# Patient Record
Sex: Female | Born: 1985
Health system: Southern US, Community
[De-identification: ages and names within clinical notes are randomized; demographics above are authoritative.]

## PROBLEM LIST (undated history)

## (undated) DIAGNOSIS — E663 Overweight: Secondary | ICD-10-CM

## (undated) HISTORY — DX: Overweight: E66.3

---
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
## 2007-03-07 ENCOUNTER — Emergency Department (HOSPITAL_COMMUNITY): Admission: EM | Admit: 2007-03-07 | Discharge: 2007-03-07 | Payer: Self-pay | Admitting: Emergency Medicine

## 2007-03-09 ENCOUNTER — Emergency Department (HOSPITAL_COMMUNITY): Admission: EM | Admit: 2007-03-09 | Discharge: 2007-03-09 | Payer: Self-pay | Admitting: Family Medicine

## 2007-06-07 ENCOUNTER — Inpatient Hospital Stay (HOSPITAL_COMMUNITY): Admission: AD | Admit: 2007-06-07 | Discharge: 2007-06-10 | Payer: Self-pay | Admitting: Obstetrics & Gynecology

## 2007-06-07 ENCOUNTER — Emergency Department (HOSPITAL_COMMUNITY): Admission: EM | Admit: 2007-06-07 | Discharge: 2007-06-07 | Payer: Self-pay | Admitting: Emergency Medicine

## 2007-07-14 ENCOUNTER — Emergency Department (HOSPITAL_COMMUNITY): Admission: EM | Admit: 2007-07-14 | Discharge: 2007-07-14 | Payer: Self-pay | Admitting: Emergency Medicine

## 2007-11-16 ENCOUNTER — Inpatient Hospital Stay (HOSPITAL_COMMUNITY): Admission: AD | Admit: 2007-11-16 | Discharge: 2007-11-16 | Payer: Self-pay | Admitting: Family Medicine

## 2008-02-14 ENCOUNTER — Emergency Department (HOSPITAL_COMMUNITY): Admission: EM | Admit: 2008-02-14 | Discharge: 2008-02-14 | Payer: Self-pay | Admitting: Emergency Medicine

## 2008-10-10 IMAGING — CR DG FOREARM 2V*R*
2 series · 2 of 2 positions shown · non-contrast
Comparison: None

CLINICAL DATA: Trauma with forearm pain

RIGHT FOREARM - 2 VIEW

[view not recorded (1 of 2)]
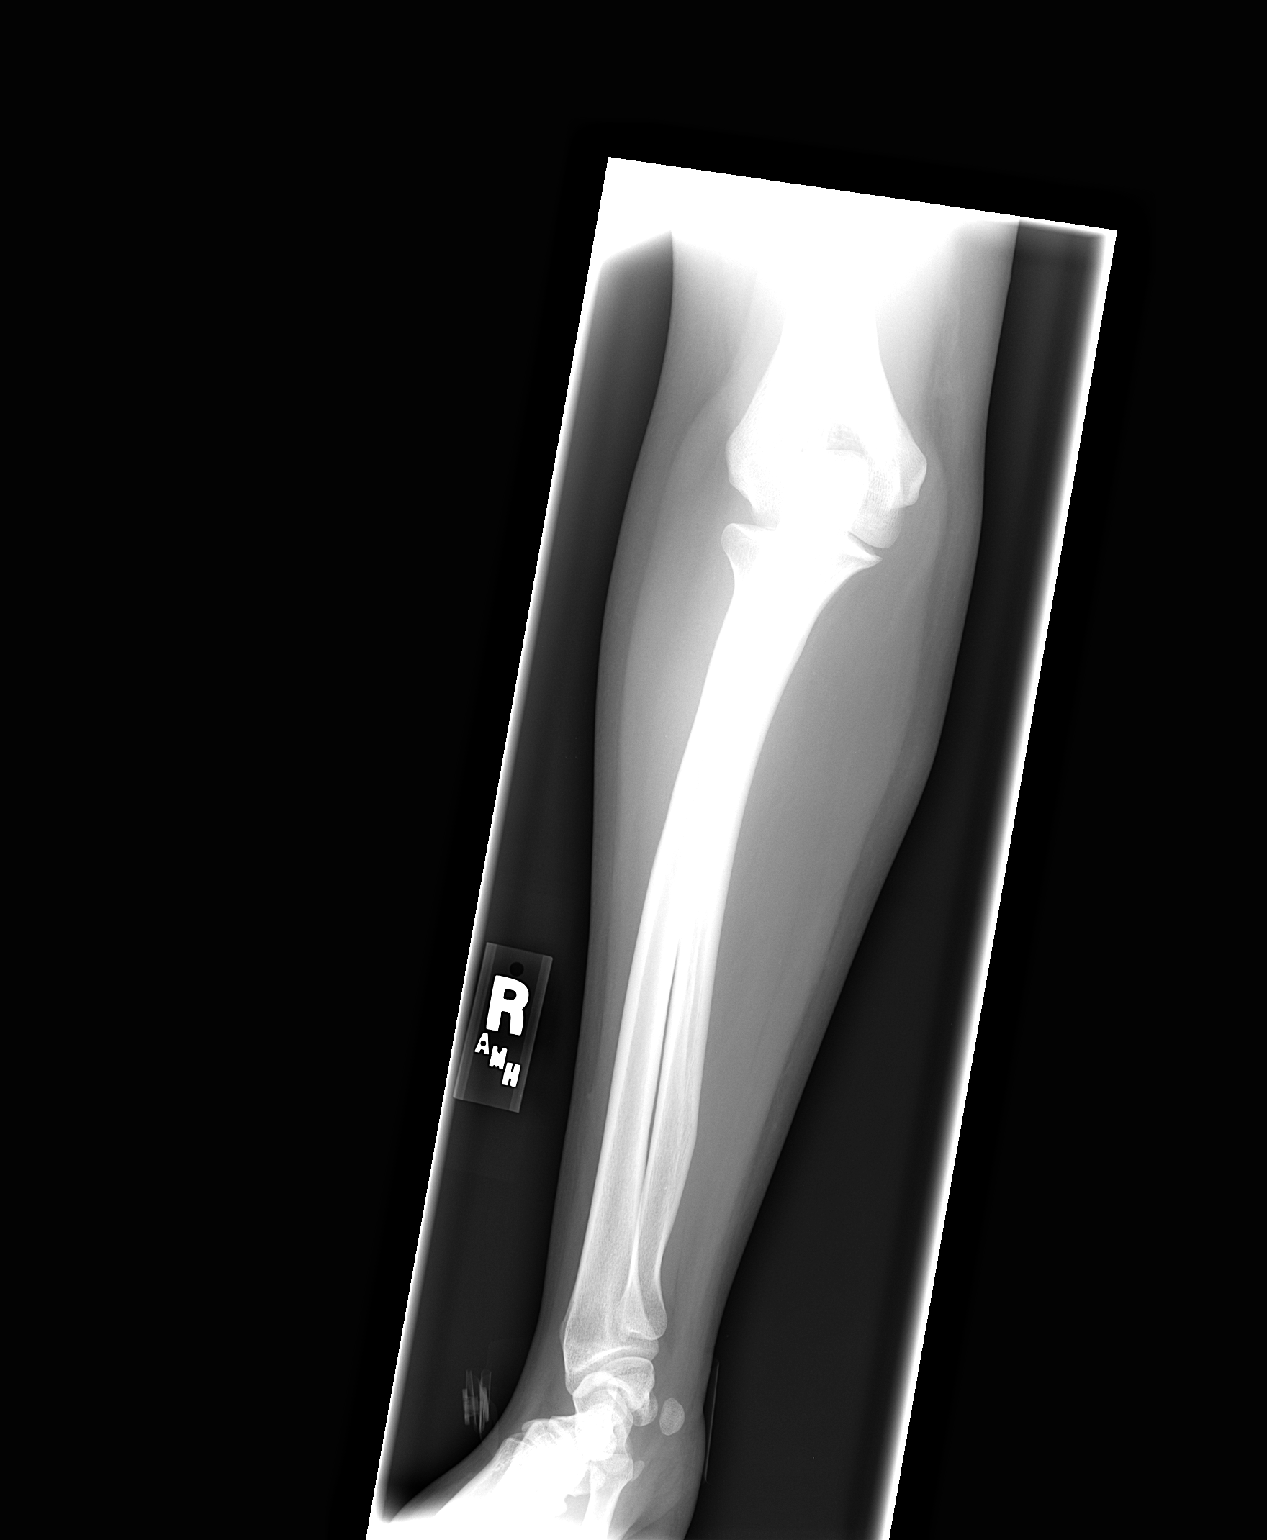

[view not recorded (2 of 2)]
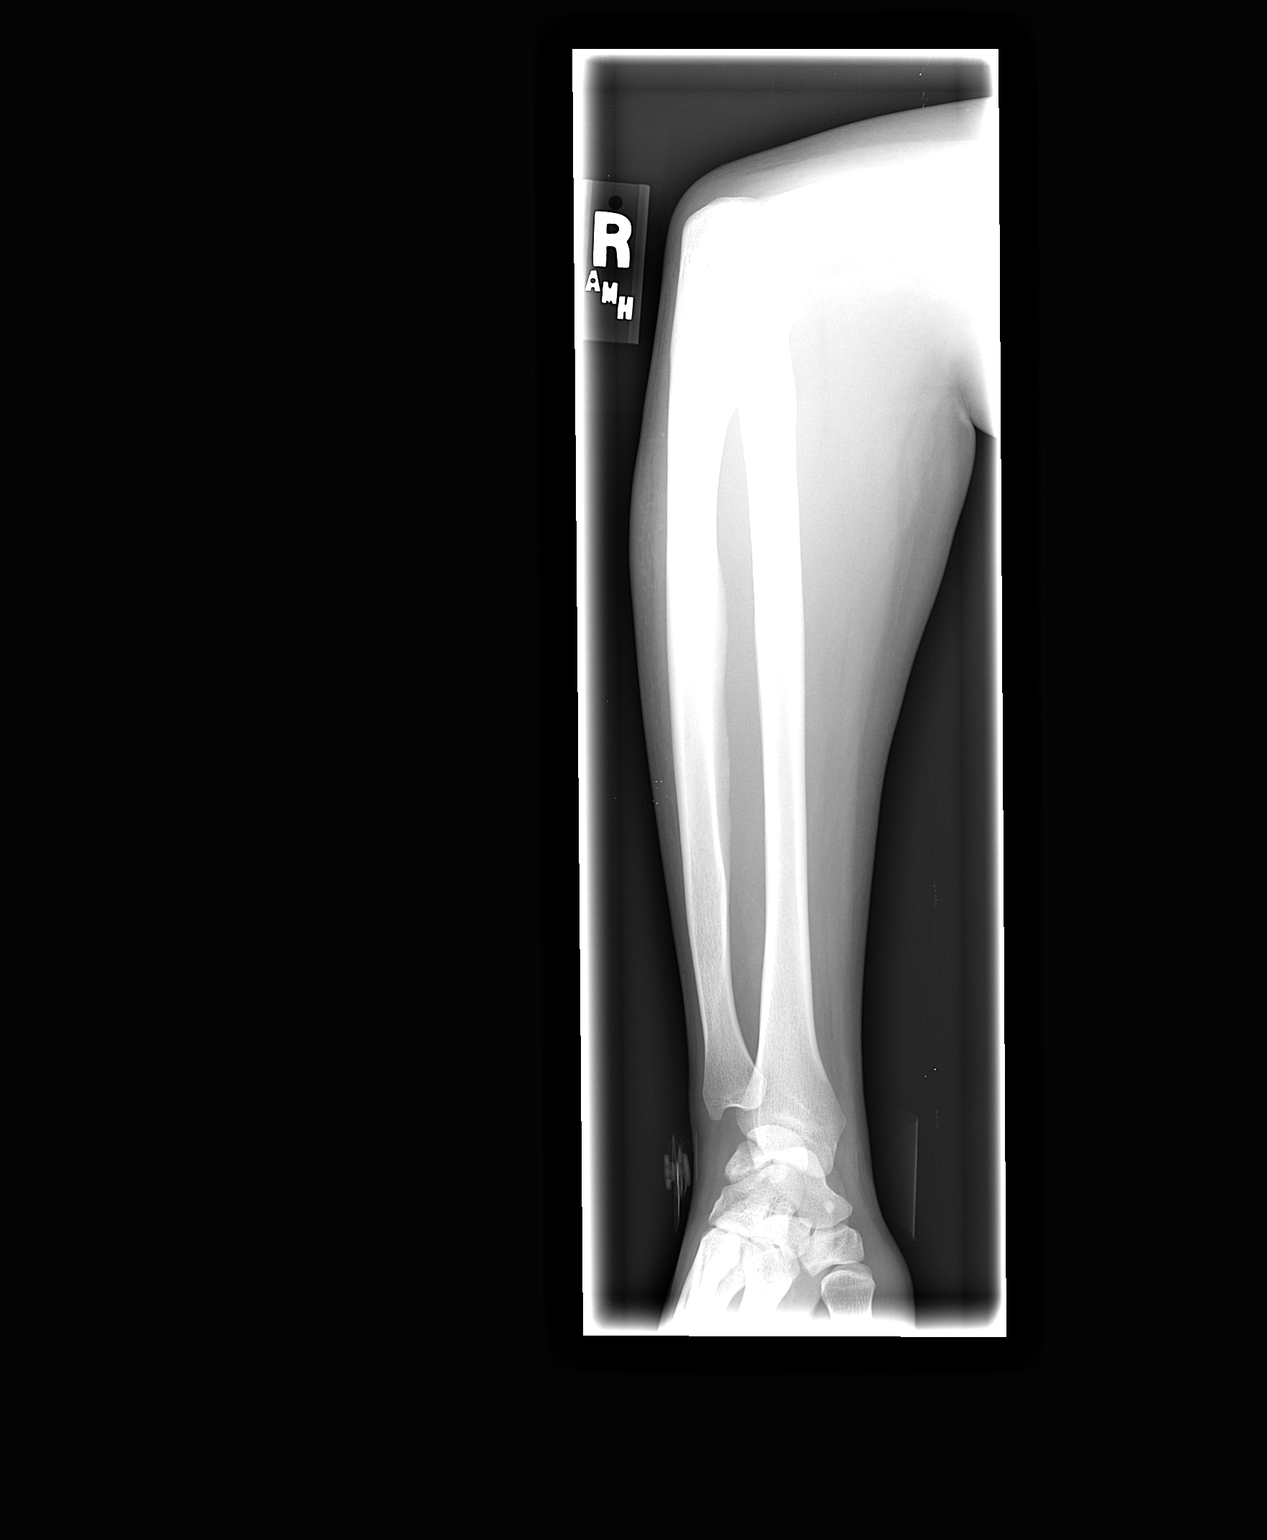

[2 of 2 positions shown; findings below may reference images not displayed]

FINDINGS: No fracture or visualized dislocation.  Soft tissues are
unremarkable.
IMPRESSION: Normal right forearm.

## 2009-01-26 ENCOUNTER — Emergency Department (HOSPITAL_COMMUNITY): Admission: EM | Admit: 2009-01-26 | Discharge: 2009-01-27 | Payer: Self-pay | Admitting: Emergency Medicine

## 2009-01-31 ENCOUNTER — Emergency Department (HOSPITAL_COMMUNITY): Admission: EM | Admit: 2009-01-31 | Discharge: 2009-01-31 | Payer: Self-pay | Admitting: Emergency Medicine

## 2009-02-16 ENCOUNTER — Inpatient Hospital Stay (HOSPITAL_COMMUNITY): Admission: AD | Admit: 2009-02-16 | Discharge: 2009-02-16 | Payer: Self-pay | Admitting: Obstetrics & Gynecology

## 2009-02-18 ENCOUNTER — Inpatient Hospital Stay (HOSPITAL_COMMUNITY): Admission: AD | Admit: 2009-02-18 | Discharge: 2009-02-18 | Payer: Self-pay | Admitting: Obstetrics & Gynecology

## 2009-02-22 ENCOUNTER — Inpatient Hospital Stay (HOSPITAL_COMMUNITY): Admission: AD | Admit: 2009-02-22 | Discharge: 2009-02-22 | Payer: Self-pay | Admitting: Obstetrics & Gynecology

## 2009-02-24 ENCOUNTER — Ambulatory Visit (HOSPITAL_COMMUNITY): Admission: RE | Admit: 2009-02-24 | Discharge: 2009-02-24 | Payer: Self-pay | Admitting: Obstetrics and Gynecology

## 2009-03-03 ENCOUNTER — Emergency Department (HOSPITAL_COMMUNITY): Admission: EM | Admit: 2009-03-03 | Discharge: 2009-03-04 | Payer: Self-pay | Admitting: Emergency Medicine

## 2009-03-16 ENCOUNTER — Emergency Department (HOSPITAL_COMMUNITY): Admission: EM | Admit: 2009-03-16 | Discharge: 2009-03-16 | Payer: Self-pay | Admitting: Emergency Medicine

## 2009-04-17 ENCOUNTER — Inpatient Hospital Stay (HOSPITAL_COMMUNITY): Admission: AD | Admit: 2009-04-17 | Discharge: 2009-04-17 | Payer: Self-pay | Admitting: Obstetrics & Gynecology

## 2009-04-22 ENCOUNTER — Inpatient Hospital Stay (HOSPITAL_COMMUNITY): Admission: AD | Admit: 2009-04-22 | Discharge: 2009-04-22 | Payer: Self-pay | Admitting: Obstetrics and Gynecology

## 2009-06-25 ENCOUNTER — Inpatient Hospital Stay (HOSPITAL_COMMUNITY): Admission: AD | Admit: 2009-06-25 | Discharge: 2009-06-25 | Payer: Self-pay | Admitting: Obstetrics and Gynecology

## 2009-09-01 ENCOUNTER — Inpatient Hospital Stay (HOSPITAL_COMMUNITY): Admission: AD | Admit: 2009-09-01 | Discharge: 2009-09-02 | Payer: Self-pay | Admitting: Obstetrics and Gynecology

## 2009-10-05 ENCOUNTER — Encounter (INDEPENDENT_AMBULATORY_CARE_PROVIDER_SITE_OTHER): Payer: Self-pay | Admitting: Obstetrics and Gynecology

## 2009-10-05 ENCOUNTER — Ambulatory Visit: Payer: Self-pay | Admitting: Vascular Surgery

## 2009-10-05 ENCOUNTER — Ambulatory Visit (HOSPITAL_COMMUNITY): Admission: RE | Admit: 2009-10-05 | Discharge: 2009-10-05 | Payer: Self-pay | Admitting: Obstetrics and Gynecology

## 2009-10-15 ENCOUNTER — Inpatient Hospital Stay (HOSPITAL_COMMUNITY): Admission: AD | Admit: 2009-10-15 | Discharge: 2009-10-15 | Payer: Self-pay | Admitting: Obstetrics and Gynecology

## 2009-10-16 ENCOUNTER — Inpatient Hospital Stay (HOSPITAL_COMMUNITY): Admission: AD | Admit: 2009-10-16 | Discharge: 2009-10-19 | Payer: Self-pay | Admitting: Obstetrics and Gynecology

## 2009-11-29 ENCOUNTER — Emergency Department (HOSPITAL_COMMUNITY): Admission: EM | Admit: 2009-11-29 | Discharge: 2009-11-29 | Payer: Self-pay | Admitting: Emergency Medicine

## 2010-03-18 ENCOUNTER — Emergency Department (HOSPITAL_COMMUNITY)
Admission: EM | Admit: 2010-03-18 | Discharge: 2010-03-18 | Disposition: A | Discharge status: Home / Self Care | Payer: Medicaid Other | Attending: Emergency Medicine | Admitting: Emergency Medicine

## 2010-03-18 DIAGNOSIS — R112 Nausea with vomiting, unspecified: Secondary | ICD-10-CM | POA: Insufficient documentation

## 2010-03-18 DIAGNOSIS — R197 Diarrhea, unspecified: Secondary | ICD-10-CM | POA: Insufficient documentation

## 2010-03-18 LAB — URINALYSIS, ROUTINE W REFLEX MICROSCOPIC
Glucose, UA: NEGATIVE mg/dL
Hgb urine dipstick: NEGATIVE
Urobilinogen, UA: 1 mg/dL (ref 0.0–1.0)

## 2010-03-22 LAB — ETHANOL: Alcohol, Ethyl (B): 387 mg/dL — ABNORMAL HIGH (ref 0–10)

## 2010-03-22 LAB — RAPID URINE DRUG SCREEN, HOSP PERFORMED
Amphetamines: NOT DETECTED
Barbiturates: NOT DETECTED
Benzodiazepines: NOT DETECTED
Tetrahydrocannabinol: NOT DETECTED

## 2010-03-22 LAB — URINALYSIS, ROUTINE W REFLEX MICROSCOPIC
Bilirubin Urine: NEGATIVE
Hgb urine dipstick: NEGATIVE
Ketones, ur: NEGATIVE mg/dL
Nitrite: NEGATIVE

## 2010-03-22 LAB — POCT PREGNANCY, URINE: Preg Test, Ur: NEGATIVE

## 2010-03-24 LAB — CBC
Hemoglobin: 11.6 g/dL — ABNORMAL LOW (ref 12.0–15.0)
MCH: 31.1 pg (ref 26.0–34.0)
MCHC: 34 g/dL (ref 30.0–36.0)
MCV: 91.7 fL (ref 78.0–100.0)
RDW: 14.5 % (ref 11.5–15.5)
WBC: 15.6 10*3/uL — ABNORMAL HIGH (ref 4.0–10.5)

## 2010-03-24 LAB — RPR: RPR Ser Ql: NONREACTIVE

## 2010-03-25 LAB — URINALYSIS, ROUTINE W REFLEX MICROSCOPIC
Bilirubin Urine: NEGATIVE
Glucose, UA: NEGATIVE mg/dL
Hgb urine dipstick: NEGATIVE
Protein, ur: NEGATIVE mg/dL
Specific Gravity, Urine: 1.01 (ref 1.005–1.030)

## 2010-03-28 LAB — URINALYSIS, ROUTINE W REFLEX MICROSCOPIC
Bilirubin Urine: NEGATIVE
Hgb urine dipstick: NEGATIVE
Ketones, ur: NEGATIVE mg/dL
Nitrite: NEGATIVE
Specific Gravity, Urine: 1.01 (ref 1.005–1.030)
pH: 6.5 (ref 5.0–8.0)

## 2010-03-28 LAB — WET PREP, GENITAL: WBC, Wet Prep HPF POC: NONE SEEN

## 2010-03-30 LAB — COMPREHENSIVE METABOLIC PANEL
ALT: 8 U/L (ref 0–35)
AST: 13 U/L (ref 0–37)
AST: 16 U/L (ref 0–37)
Albumin: 3.2 g/dL — ABNORMAL LOW (ref 3.5–5.2)
Albumin: 3.9 g/dL (ref 3.5–5.2)
Alkaline Phosphatase: 30 U/L — ABNORMAL LOW (ref 39–117)
Alkaline Phosphatase: 38 U/L — ABNORMAL LOW (ref 39–117)
CO2: 23 mEq/L (ref 19–32)
Calcium: 9.2 mg/dL (ref 8.4–10.5)
Chloride: 105 mEq/L (ref 96–112)
Chloride: 100 mEq/L (ref 96–112)
Creatinine, Ser: 0.43 mg/dL (ref 0.4–1.2)
GFR calc Af Amer: >60 mL/min (ref >60)
GFR calc Af Amer: >60 mL/min (ref >60)
GFR calc non Af Amer: >60 mL/min (ref >60)
Potassium: 3 mEq/L — ABNORMAL LOW (ref 3.5–5.1)
Sodium: 131 mEq/L — ABNORMAL LOW (ref 135–145)
Total Bilirubin: 0.2 mg/dL — ABNORMAL LOW (ref 0.3–1.2)
Total Bilirubin: 0.4 mg/dL (ref 0.3–1.2)

## 2010-03-30 LAB — WET PREP, GENITAL
Clue Cells Wet Prep HPF POC: NONE SEEN
Trich, Wet Prep: NONE SEEN
Trich, Wet Prep: NONE SEEN

## 2010-03-30 LAB — URINALYSIS, ROUTINE W REFLEX MICROSCOPIC
Bilirubin Urine: NEGATIVE
Bilirubin Urine: NEGATIVE
Bilirubin Urine: NEGATIVE
Hgb urine dipstick: NEGATIVE
Hgb urine dipstick: NEGATIVE
Hgb urine dipstick: NEGATIVE
Ketones, ur: NEGATIVE mg/dL
Ketones, ur: NEGATIVE mg/dL
Ketones, ur: NEGATIVE mg/dL
Nitrite: NEGATIVE
Nitrite: NEGATIVE
Protein, ur: NEGATIVE mg/dL
Protein, ur: NEGATIVE mg/dL
Protein, ur: NEGATIVE mg/dL
Protein, ur: NEGATIVE mg/dL
Specific Gravity, Urine: 1.025 (ref 1.005–1.030)
Specific Gravity, Urine: 1.025 (ref 1.005–1.030)
Urobilinogen, UA: 0.2 mg/dL (ref 0.0–1.0)
Urobilinogen, UA: 0.2 mg/dL (ref 0.0–1.0)
Urobilinogen, UA: 1 mg/dL (ref 0.0–1.0)
pH: 6.5 (ref 5.0–8.0)

## 2010-03-30 LAB — URINE CULTURE

## 2010-03-30 LAB — CBC
HCT: 37.5 % (ref 36.0–46.0)
Hemoglobin: 12.8 g/dL (ref 12.0–15.0)
MCHC: 33.6 g/dL (ref 30.0–36.0)
MCV: 88 fL (ref 78.0–100.0)
Platelets: 268 10*3/uL (ref 150–400)
RBC: 4.21 MIL/uL (ref 3.87–5.11)
WBC: 9.1 10*3/uL (ref 4.0–10.5)
WBC: 6.4 10*3/uL (ref 4.0–10.5)

## 2010-03-30 LAB — URINE MICROSCOPIC-ADD ON

## 2010-03-30 LAB — DIFFERENTIAL
Basophils Relative: 0 % (ref 0–1)
Eosinophils Absolute: 0.1 10*3/uL (ref 0.0–0.7)
Monocytes Relative: 6 % (ref 3–12)
Neutrophils Relative %: 63 % (ref 43–77)

## 2010-03-30 LAB — HCG, QUANTITATIVE, PREGNANCY: hCG, Beta Chain, Quant, S: 14438 m[IU]/mL — ABNORMAL HIGH (ref <5)

## 2010-03-30 LAB — ABO/RH: ABO/RH(D): O POS

## 2010-03-31 LAB — URINALYSIS, ROUTINE W REFLEX MICROSCOPIC
Glucose, UA: NEGATIVE mg/dL
Nitrite: NEGATIVE
Protein, ur: NEGATIVE mg/dL
pH: 7 (ref 5.0–8.0)

## 2010-03-31 LAB — URINE MICROSCOPIC-ADD ON

## 2010-04-04 LAB — RPR: RPR Ser Ql: NONREACTIVE

## 2010-04-04 LAB — WET PREP, GENITAL

## 2010-04-04 LAB — GC/CHLAMYDIA PROBE AMP, GENITAL
Chlamydia, DNA Probe: NEGATIVE
GC Probe Amp, Genital: NEGATIVE

## 2010-04-26 LAB — HERPES SIMPLEX VIRUS CULTURE: Culture: DETECTED

## 2010-04-26 LAB — POCT URINALYSIS DIP (DEVICE)
Bilirubin Urine: NEGATIVE
Nitrite: NEGATIVE
Protein, ur: NEGATIVE mg/dL
Urobilinogen, UA: 1 mg/dL (ref 0.0–1.0)
pH: 8.5 — ABNORMAL HIGH (ref 5.0–8.0)

## 2010-04-26 LAB — WET PREP, GENITAL: WBC, Wet Prep HPF POC: NONE SEEN
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
## 2010-10-05 LAB — CBC
Platelets: 294
RDW: 13.6
WBC: 14.1 — ABNORMAL HIGH

## 2010-10-05 LAB — CULTURE, BLOOD (ROUTINE X 2)

## 2010-10-05 LAB — DIFFERENTIAL
Basophils Absolute: 0
Basophils Relative: 0
Eosinophils Absolute: 0
Neutrophils Relative %: 91 — ABNORMAL HIGH

## 2010-10-06 LAB — CBC
HCT: 30.8 — ABNORMAL LOW
MCV: 88.7
RBC: 3.47 — ABNORMAL LOW
WBC: 7

## 2010-10-11 LAB — URINALYSIS, ROUTINE W REFLEX MICROSCOPIC
Glucose, UA: NEGATIVE
Ketones, ur: NEGATIVE
Leukocytes, UA: NEGATIVE
pH: 8

## 2010-10-11 LAB — POCT PREGNANCY, URINE: Preg Test, Ur: NEGATIVE

## 2010-10-11 LAB — WET PREP, GENITAL: Yeast Wet Prep HPF POC: NONE SEEN

## 2010-10-11 LAB — URINE MICROSCOPIC-ADD ON

## 2011-04-28 ENCOUNTER — Emergency Department (HOSPITAL_COMMUNITY)
Admission: EM | Admit: 2011-04-28 | Discharge: 2011-04-28 | Disposition: A | Discharge status: Home / Self Care | Payer: Medicaid Other | Attending: Emergency Medicine | Admitting: Emergency Medicine

## 2011-04-28 ENCOUNTER — Encounter (HOSPITAL_COMMUNITY): Payer: Self-pay | Admitting: Adult Health

## 2011-04-28 DIAGNOSIS — R209 Unspecified disturbances of skin sensation: Secondary | ICD-10-CM | POA: Insufficient documentation

## 2011-04-28 DIAGNOSIS — K529 Noninfective gastroenteritis and colitis, unspecified: Secondary | ICD-10-CM

## 2011-04-28 DIAGNOSIS — K5289 Other specified noninfective gastroenteritis and colitis: Secondary | ICD-10-CM | POA: Insufficient documentation

## 2011-04-28 DIAGNOSIS — R110 Nausea: Secondary | ICD-10-CM | POA: Insufficient documentation

## 2011-04-28 MED ORDER — ONDANSETRON 8 MG PO TBDP
8.0000 mg | ORAL_TABLET | Freq: Once | ORAL | Status: AC
  Administered 2011-04-28: 8 mg via ORAL
  Filled 2011-04-28 (×2): qty 1

## 2011-04-28 MED ORDER — ONDANSETRON HCL 4 MG PO TABS: 4.0000 mg | ORAL_TABLET | Freq: Four times a day (QID) | ORAL | Status: AC

## 2011-04-28 NOTE — ED Notes (Signed)
Woke up this am with diarrhea and vomitting assocaited with abdominal cramping. Both her son and boyfriend have been sick with same.

## 2011-04-28 NOTE — ED Provider Notes (Signed)
History     CSN: [id number]  Arrival date & time 04/28/11  0703   First MD Initiated Contact with Patient 04/28/11 [phone]      Chief Complaint  Patient presents with  . Diarrhea  . Nausea    (Consider location/radiation/quality/duration/timing/severity/associated sxs/prior treatment) HPI Comments: Michelle Frey is a 26 y.o. female with complaint of vomiting and diarrhea. That started this morning. Her family members have the same illness in the last week. She denies fever. She has not had dizziness, weakness, headache. She's not tried any medication for the problem.  Patient is a 26 y.o. female presenting with diarrhea.  Diarrhea The primary symptoms include diarrhea.    History reviewed. No pertinent past medical history.  History reviewed. No pertinent past surgical history.  History reviewed. No pertinent family history.  History  Substance Use Topics  . Smoking status: Never Smoker   . Smokeless tobacco: Not on file  . Alcohol Use: Yes    OB History    Grav Para Term Preterm Abortions TAB SAB Ect Mult Living                  Review of Systems  Gastrointestinal: Positive for diarrhea.  All other systems reviewed and are negative.    Allergies  Amoxicillin and Penicillins  Home Medications   Current Outpatient Rx  Name Route Sig Dispense Refill  . OVER THE COUNTER MEDICATION Oral Take 1-2 tablets by mouth See admin instructions. Take 2 tablets in the morning and 1 in the afternoon.SUPER HD DIETARY SUPPLEMENT    . ONDANSETRON HCL 4 MG PO TABS Oral Take 1 tablet (4 mg total) by mouth every 6 (six) hours. 12 tablet 0    BP 116/73  Pulse 92  Temp(Src) 99 F (37.2 C) (Oral)  Resp 18  SpO2 99%  Physical Exam  Nursing note and vitals reviewed. Constitutional: She is oriented to person, place, and time. She appears well-developed and well-nourished.  HENT:  Head: Normocephalic and atraumatic.  Eyes: Conjunctivae and EOM are normal. Pupils are equal,  round, and reactive to light.  Neck: Normal range of motion and phonation normal. Neck supple.  Cardiovascular: Normal rate, regular rhythm and intact distal pulses.   Pulmonary/Chest: Effort normal and breath sounds normal. She exhibits no tenderness.  Abdominal: Soft. She exhibits no distension. There is tenderness (mild diffuse tenderness). There is no guarding.  Musculoskeletal: Normal range of motion.  Neurological: She is alert and oriented to person, place, and time. She has normal strength. She exhibits normal muscle tone.  Skin: Skin is warm and dry.  Psychiatric: She has a normal mood and affect. Her behavior is normal. Judgment and thought content normal.    ED Course  Procedures (including critical care time)  Labs Reviewed - No data to display No results found.   1. Gastroenteritis       MDM  Evaluation consistent with communicable gastroenteritis. Doubt serious bacterial infection or metabolic instability or dehydration.  Plan: Home Medications- Zofran; Home Treatments- gradual diet advance; Recommended follow up- PCP prn        Flint Melter, MD 04/28/11 [phone]

## 2011-04-28 NOTE — Discharge Instructions (Signed)
Start with a clear liquid diet and gradually advanced to a bland diet. Use Tylenol for fever or pain.  Diarrhea Infections caused by germs (bacterial) or a virus commonly cause diarrhea. Your caregiver has determined that with time, rest and fluids, the diarrhea should improve. In general, eat normally while drinking more water than usual. Although water may prevent dehydration, it does not contain salt and minerals (electrolytes). Broths, weak tea without caffeine and oral rehydration solutions (ORS) replace fluids and electrolytes. Small amounts of fluids should be taken frequently. Large amounts at one time may not be tolerated. Plain water may be harmful in infants and the elderly. Oral rehydrating solutions (ORS) are available at pharmacies and grocery stores. ORS replace water and important electrolytes in proper proportions. Sports drinks are not as effective as ORS and may be harmful due to sugars worsening diarrhea.  ORS is especially recommended for use in children with diarrhea. As a general guideline for children, replace any new fluid losses from diarrhea and/or vomiting with ORS as follows:   If your child weighs 22 pounds or under (10 kg or less), give 60-120 mL ( -  cup or 2 - 4 ounces) of ORS for each episode of diarrheal stool or vomiting episode.   If your child weighs more than 22 pounds (more than 10 kgs), give 120-240 mL ( - 1 cup or 4 - 8 ounces) of ORS for each diarrheal stool or episode of vomiting.   While correcting for dehydration, children should eat normally. However, foods high in sugar should be avoided because this may worsen diarrhea. Large amounts of carbonated soft drinks, juice, gelatin desserts and other highly sugared drinks should be avoided.   After correction of dehydration, other liquids that are appealing to the child may be added. Children should drink small amounts of fluids frequently and fluids should be increased as tolerated. Children should drink  enough fluids to keep urine clear or pale yellow.   Adults should eat normally while drinking more fluids than usual. Drink small amounts of fluids frequently and increase as tolerated. Drink enough fluids to keep urine clear or pale yellow. Broths, weak decaffeinated tea, lemon lime soft drinks (allowed to go flat) and ORS replace fluids and electrolytes.   Avoid:   Carbonated drinks.   Juice.   Extremely hot or cold fluids.   Caffeine drinks.   Fatty, greasy foods.   Alcohol.   Tobacco.   Too much intake of anything at one time.   Gelatin desserts.   Probiotics are active cultures of beneficial bacteria. They may lessen the amount and number of diarrheal stools in adults. Probiotics can be found in yogurt with active cultures and in supplements.   Wash hands well to avoid spreading bacteria and virus.   Anti-diarrheal medications are not recommended for infants and children.   Only take over-the-counter or prescription medicines for pain, discomfort or fever as directed by your caregiver. Do not give aspirin to children because it may cause Reye's Syndrome.   For adults, ask your caregiver if you should continue all prescribed and over-the-counter medicines.   If your caregiver has given you a follow-up appointment, it is very important to keep that appointment. Not keeping the appointment could result in a chronic or permanent injury, and disability. If there is any problem keeping the appointment, you must call back to this facility for assistance.  SEEK IMMEDIATE MEDICAL CARE IF:   You or your child is unable to keep fluids down  or other symptoms or problems become worse in spite of treatment.   Vomiting or diarrhea develops and becomes persistent.   There is vomiting of blood or bile (green material).   There is blood in the stool or the stools are black and tarry.   There is no urine output in 6-8 hours or there is only a small amount of very dark urine.    Abdominal pain develops, increases or localizes.   You have a fever.   Your baby is older than 3 months with a rectal temperature of 102 F (38.9 C) or higher.   Your baby is 14 months old or younger with a rectal temperature of 100.4 F (38 C) or higher.   You or your child develops excessive weakness, dizziness, fainting or extreme thirst.   You or your child develops a rash, stiff neck, severe headache or become irritable or sleepy and difficult to awaken.  MAKE SURE YOU:   Understand these instructions.   Will watch your condition.   Will get help right away if you are not doing well or get worse.  Document Released: 12/16/2001 Document Revised: 12/15/2010 Document Reviewed: 11/02/2008 [facility] Patient Information 2012 [place], [place].  B.R.A.T. Diet Your doctor has recommended the B.R.A.T. diet for you or your child until the condition improves. This is often used to help control diarrhea and vomiting symptoms. If you or your child can tolerate clear liquids, you may have:  Bananas.   Rice.   Applesauce.   Toast (and other simple starches such as crackers, potatoes, noodles).  Be sure to avoid dairy products, meats, and fatty foods until symptoms are better. Fruit juices such as apple, grape, and prune juice can make diarrhea worse. Avoid these. Continue this diet for 2 days or as instructed by your caregiver. Document Released: 12/26/2004 Document Revised: 12/15/2010 Document Reviewed: 06/14/2006 [facility] Patient Information 2012 [place], [place].Clear Liquid Diet The clear liquid dietconsists of foods that are liquid or will become liquid at room temperature.You should be able to see through the liquid and beverages. Examples of foods allowed on a clear liquid diet include fruit juice, broth or bouillon, gelatin, or frozen ice pops. The purpose of this diet is to provide necessary fluid, electrolytes such as sodium and potassium, and energy to keep the body functioning  during times when you are not able to consume a regular diet.A clear liquid diet should not be continued for long periods of time as it is not nutritionally adequate.  REASONS FOR USING A CLEAR LIQUID DIET  In sudden onset (acute) conditions for a patient before or after surgery.   As the first step in oral feeding.   For fluid and electrolyte replacement in diarrheal diseases.   As a diet before certain medical tests are performed.  ADEQUACY The clear liquid diet is adequate only in ascorbic acid, according to the Recommended Dietary Allowances of the [employer]. CHOOSING FOODS Breads and Starches  Allowed:  None are allowed.   Avoid: All are avoided.  Vegetables  Allowed:  Strained tomato or vegetable juice.   Avoid: Any others.  Fruit  Allowed:  Strained fruit juices and fruit drinks. Include 1 serving of citrus or vitamin C-enriched fruit juice daily.   Avoid: Any others.  Meat and Meat Substitutes  Allowed:  None are allowed.   Avoid: All are avoided.  Milk  Allowed:  None are allowed.   Avoid: All are avoided.  Soups and Combination Foods  Allowed:  Clear  bouillon, broth, or strained broth-based soups.   Avoid: Any others.  Desserts and Sweets  Allowed:  Sugar, honey. High protein gelatin. Flavored gelatin, ices, or frozen ice pops that do not contain milk.   Avoid: Any others.  Fats and Oils  Allowed:  None are allowed.   Avoid: All are avoided.  Beverages  Allowed: Cereal beverages, coffee (regular or decaffeinated), tea, or soda at the discretion of your caregiver.   Avoid: Any others.  Condiments  Allowed:  Iodized salt.   Avoid: Any others, including pepper.  Supplements  Allowed:  Liquid nutrition beverages.   Avoid: Any others that contain lactose or fiber.  SAMPLE MEAL PLAN Breakfast  4 oz (120 mL) strained orange juice.    to 1 cup (125 to 250 mL) gelatin (plain or fortified).   1 cup (250 mL) beverage (coffee  or tea).   Sugar, if desired.  Midmorning Snack   cup (125 mL) gelatin (plain or fortified).  Lunch  1 cup (250 mL) broth or consomm.   4 oz (120 mL) strained grapefruit juice.    cup (125 mL) gelatin (plain or fortified).   1 cup (250 mL) beverage (coffee or tea).   Sugar, if desired.  Midafternoon Snack   cup (125 mL) fruit ice.    cup (125 mL) strained fruit juice.  Dinner  1 cup (250 mL) broth or consomm.    cup (125 mL) cranberry juice.    cup (125 mL) flavored gelatin (plain or fortified).   1 cup (250 mL) beverage (coffee or tea).   Sugar, if desired.  Evening Snack  4 oz (120 mL) strained apple juice (vitamin C-fortified).    cup (125 mL) flavored gelatin (plain or fortified).  Document Released: 12/26/2004 Document Revised: 12/15/2010 Document Reviewed: 03/25/2010 [facility] Patient Information 2012 [place], [place].Viral Gastroenteritis Viral gastroenteritis is also known as stomach flu. This condition affects the stomach and intestinal tract. It can cause sudden diarrhea and vomiting. The illness typically lasts 3 to 8 days. Most people develop an immune response that eventually gets rid of the virus. While this natural response develops, the virus can make you quite ill. CAUSES  Many different viruses can cause gastroenteritis, such as rotavirus or noroviruses. You can catch one of these viruses by consuming contaminated food or water. You may also catch a virus by sharing utensils or other personal items with an infected person or by touching a contaminated surface. SYMPTOMS  The most common symptoms are diarrhea and vomiting. These problems can cause a severe loss of body fluids (dehydration) and a body salt (electrolyte) imbalance. Other symptoms may include:  Fever.   Headache.   Fatigue.   Abdominal pain.  DIAGNOSIS  Your caregiver can usually diagnose viral gastroenteritis based on your symptoms and a physical exam. A stool sample may also  be taken to test for the presence of viruses or other infections. TREATMENT  This illness typically goes away on its own. Treatments are aimed at rehydration. The most serious cases of viral gastroenteritis involve vomiting so severely that you are not able to keep fluids down. In these cases, fluids must be given through an intravenous line (IV). HOME CARE INSTRUCTIONS   Drink enough fluids to keep your urine clear or pale yellow. Drink small amounts of fluids frequently and increase the amounts as tolerated.   Ask your caregiver for specific rehydration instructions.   Avoid:   Foods high in sugar.   Alcohol.   Carbonated drinks.  Tobacco.   Juice.   Caffeine drinks.   Extremely hot or cold fluids.   Fatty, greasy foods.   Too much intake of anything at one time.   Dairy products until 24 to 48 hours after diarrhea stops.   You may consume probiotics. Probiotics are active cultures of beneficial bacteria. They may lessen the amount and number of diarrheal stools in adults. Probiotics can be found in yogurt with active cultures and in supplements.   Wash your hands well to avoid spreading the virus.   Only take over-the-counter or prescription medicines for pain, discomfort, or fever as directed by your caregiver. Do not give aspirin to children. Antidiarrheal medicines are not recommended.   Ask your caregiver if you should continue to take your regular prescribed and over-the-counter medicines.   Keep all follow-up appointments as directed by your caregiver.  SEEK IMMEDIATE MEDICAL CARE IF:   You are unable to keep fluids down.   You do not urinate at least once every 6 to 8 hours.   You develop shortness of breath.   You notice blood in your stool or vomit. This may look like coffee grounds.   You have abdominal pain that increases or is concentrated in one small area (localized).   You have persistent vomiting or diarrhea.   You have a fever.   The  patient is a child younger than 3 months, and he or she has a fever.   The patient is a child older than 3 months, and he or she has a fever and persistent symptoms.   The patient is a child older than 3 months, and he or she has a fever and symptoms suddenly get worse.   The patient is a baby, and he or she has no tears when crying.  MAKE SURE YOU:   Understand these instructions.   Will watch your condition.   Will get help right away if you are not doing well or get worse.  Document Released: 12/26/2004 Document Revised: 12/15/2010 Document Reviewed: 10/12/2010 [facility] Patient Information 2012 [place], [place].

## 2011-04-28 NOTE — ED Notes (Signed)
Pt tolerating po liquids.

## 2011-08-08 ENCOUNTER — Emergency Department (HOSPITAL_COMMUNITY)
Admission: EM | Admit: 2011-08-08 | Discharge: 2011-08-08 | Disposition: A | Discharge status: Home / Self Care | Payer: No Typology Code available for payment source | Attending: Emergency Medicine | Admitting: Emergency Medicine

## 2011-08-08 ENCOUNTER — Emergency Department (HOSPITAL_COMMUNITY): Payer: No Typology Code available for payment source

## 2011-08-08 ENCOUNTER — Encounter (HOSPITAL_COMMUNITY): Payer: Self-pay | Admitting: Emergency Medicine

## 2011-08-08 DIAGNOSIS — S39012A Strain of muscle, fascia and tendon of lower back, initial encounter: Secondary | ICD-10-CM

## 2011-08-08 DIAGNOSIS — Y998 Other external cause status: Secondary | ICD-10-CM | POA: Insufficient documentation

## 2011-08-08 DIAGNOSIS — S335XXA Sprain of ligaments of lumbar spine, initial encounter: Secondary | ICD-10-CM | POA: Insufficient documentation

## 2011-08-08 DIAGNOSIS — Y93I9 Activity, other involving external motion: Secondary | ICD-10-CM | POA: Insufficient documentation

## 2011-08-08 LAB — POCT PREGNANCY, URINE: Preg Test, Ur: NEGATIVE

## 2011-08-08 MED ORDER — TRAMADOL HCL 50 MG PO TABS: 50.0000 mg | ORAL_TABLET | Freq: Four times a day (QID) | ORAL | Status: AC | PRN
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
## 2011-08-08 NOTE — ED Provider Notes (Signed)
Medical screening examination/treatment/procedure(s) were performed by non-physician practitioner and as supervising physician I was immediately available for consultation/collaboration.  Doug Sou, MD 08/08/11 [phone]
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
## 2013-07-06 ENCOUNTER — Emergency Department (HOSPITAL_COMMUNITY)
Admission: EM | Admit: 2013-07-06 | Discharge: 2013-07-06 | Disposition: A | Discharge status: Home / Self Care | Payer: BC Managed Care – PPO | Attending: Emergency Medicine | Admitting: Emergency Medicine

## 2013-07-06 ENCOUNTER — Encounter (HOSPITAL_COMMUNITY): Payer: Self-pay | Admitting: Emergency Medicine

## 2013-07-06 DIAGNOSIS — S058X9A Other injuries of unspecified eye and orbit, initial encounter: Secondary | ICD-10-CM | POA: Insufficient documentation

## 2013-07-06 DIAGNOSIS — Z79899 Other long term (current) drug therapy: Secondary | ICD-10-CM | POA: Insufficient documentation

## 2013-07-06 DIAGNOSIS — X58XXXA Exposure to other specified factors, initial encounter: Secondary | ICD-10-CM | POA: Insufficient documentation

## 2013-07-06 DIAGNOSIS — S0501XA Injury of conjunctiva and corneal abrasion without foreign body, right eye, initial encounter: Secondary | ICD-10-CM

## 2013-07-06 DIAGNOSIS — Y939 Activity, unspecified: Secondary | ICD-10-CM | POA: Insufficient documentation

## 2013-07-06 DIAGNOSIS — Z880 Allergy status to penicillin: Secondary | ICD-10-CM | POA: Insufficient documentation

## 2013-07-06 DIAGNOSIS — Y929 Unspecified place or not applicable: Secondary | ICD-10-CM | POA: Insufficient documentation

## 2013-07-06 MED ORDER — TETRACAINE HCL 0.5 % OP SOLN
1.0000 [drp] | Freq: Once | OPHTHALMIC | Status: AC
  Administered 2013-07-06: 1 [drp] via OPHTHALMIC
  Filled 2013-07-06: qty 2

## 2013-07-06 MED ORDER — FLUORESCEIN SODIUM 1 MG OP STRP
1.0000 | ORAL_STRIP | Freq: Once | OPHTHALMIC | Status: AC
  Administered 2013-07-06: 1 via OPHTHALMIC
  Filled 2013-07-06: qty 1

## 2013-07-06 MED ORDER — POLYMYXIN B-TRIMETHOPRIM 10000-0.1 UNIT/ML-% OP SOLN: 1.0000 [drp] | OPHTHALMIC | Status: AC

## 2013-07-06 NOTE — Discharge Instructions (Signed)
Warm compresses as needed for pain  Return to the emergency department if you develop any changing/worsening condition, eye drainage of pus, eye swelling, fever or any other concerns (please read additional information regarding your condition below)   Corneal Abrasion The cornea is the clear covering at the front and center of the eye. When looking at the colored portion of the eye (iris), you are looking through the cornea. This very thin tissue is made up of many layers. The surface layer is a single layer of cells (corneal epithelium) and is one of the most sensitive tissues in the body. If a scratch or injury causes the corneal epithelium to come off, it is called a corneal abrasion. If the injury extends to the tissues below the epithelium, the condition is called a corneal ulcer. CAUSES   Scratches.  Trauma.  Foreign body in the eye. Some people have recurrences of abrasions in the area of the original injury even after it has healed (recurrent erosion syndrome). Recurrent erosion syndrome generally improves and goes away with time. SYMPTOMS   Eye pain.  Difficulty or inability to keep the injured eye open.  The eye becomes very sensitive to light.  Recurrent erosions tend to happen suddenly, first thing in the morning, usually after waking up and opening the eye. DIAGNOSIS  Your health care provider can diagnose a corneal abrasion during an eye exam. Dye is usually placed in the eye using a drop or a small paper strip moistened by your tears. When the eye is examined with a special light, the abrasion shows up clearly because of the dye. TREATMENT   Small abrasions may be treated with antibiotic drops or ointment alone.  A pressure patch may be put over the eye. If this is done, follow your doctor's instructions for when to remove the patch. Do not drive or use machines while the eye patch is on. Judging distances is hard to do with a patch on. If the abrasion becomes infected  and spreads to the deeper tissues of the cornea, a corneal ulcer can result. This is serious because it can cause corneal scarring. Corneal scars interfere with light passing through the cornea and cause a loss of vision in the involved eye. HOME CARE INSTRUCTIONS  Use medicine or ointment as directed. Only take over-the-counter or prescription medicines for pain, discomfort, or fever as directed by your health care provider.  Do not drive or operate machinery if your eye is patched. Your ability to judge distances is impaired.  If your health care provider has given you a follow-up appointment, it is very important to keep that appointment. Not keeping the appointment could result in a severe eye infection or permanent loss of vision. If there is any problem keeping the appointment, let your health care provider know. SEEK MEDICAL CARE IF:   You have pain, light sensitivity, and a scratchy feeling in one eye or both eyes.  Your pressure patch keeps loosening up, and you can blink your eye under the patch after treatment.  Any kind of discharge develops from the eye after treatment or if the lids stick together in the morning.  You have the same symptoms in the morning as you did with the original abrasion days, weeks, or months after the abrasion healed. MAKE SURE YOU:   Understand these instructions.  Will watch your condition.  Will get help right away if you are not doing well or get worse. Document Released: 12/24/1999 Document Revised: 12/31/2012 Document Reviewed:  09/02/2012 Exit[employer] Patient Information 2015 [place] [place]. This information is not intended to replace advice given to you by your health care provider. Make sure you discuss any questions you have with your health care provider.

## 2013-07-06 NOTE — ED Provider Notes (Signed)
CSN: [id number]     Arrival date & time 07/06/13  [id number] History  This chart was scribed for Michelle SalesJessica Katlin Palmer PA-C, a non-physician practitioner working with [name], * by Lewanda RifeAlexandra Hurtado, ED Scribe. This patient was seen in room TR04C/TR04C and the patient's care was started at 9:46 AM     Chief Complaint  Patient presents with  . Eye Pain    The history is provided by the patient. No language interpreter was used.   HPI Comments: Michelle Frey is a 28 y.o. female who presents to the Emergency Department complaining of constant right eye pain onset this morning. Describes pain as an "irritation" and moderate in severity. Reports associated mild eye redness. Reports trying to irrigate eye with no relief of symptoms. Reports pain is exacerbated by looking down. Denies associated fever, eye discharge, foreign body sensation, and eye itchiness. Denies PMH of the same. States she wears contacts, but is not wearing them now. Also wears glasses.    History reviewed. No pertinent past medical history. History reviewed. No pertinent past surgical history. History reviewed. No pertinent family history. History  Substance Use Topics  . Smoking status: Never Smoker   . Smokeless tobacco: Not on file  . Alcohol Use: Yes   OB History   Grav Para Term Preterm Abortions TAB SAB Ect Mult Living                 Review of Systems  Constitutional: Negative for fever, chills, activity change, appetite change and fatigue.  HENT: Negative for congestion, rhinorrhea and sore throat.   Eyes: Positive for pain, redness and itching. Negative for photophobia, discharge and visual disturbance.  Neurological: Negative for dizziness, light-headedness and headaches.    Allergies  Amoxicillin and Penicillins  Home Medications   Prior to Admission medications   Medication Sig Start Date End Date Taking? Authorizing Provider  Dapsone (ACZONE) 5 % topical gel Apply 1 application  topically 2 (two) times daily.    Historical Provider, MD  levonorgestrel-ethinyl estradiol (AVIANE,ALESSE,LESSINA) 0.1-20 MG-MCG tablet Take 1 tablet by mouth daily.    Historical Provider, MD  Multiple Vitamin (MULTIVITAMIN WITH MINERALS) TABS Take 1 tablet by mouth daily.    Historical Provider, MD  OVER THE COUNTER MEDICATION Take 1-2 tablets by mouth See admin instructions. Take 2 tablets in the morning and 1 in the afternoon.SUPER HD DIETARY SUPPLEMENT    Historical Provider, MD   BP 120/75  Pulse 83  Temp(Src) 98.5 F (36.9 C) (Oral)  Resp 16  SpO2 100%  LMP 06/29/2013  Filed Vitals:   07/06/13 0935  BP: 120/75  Pulse: 83  Temp: 98.5 F (36.9 C)  TempSrc: Oral  Resp: 16  SpO2: 100%    Physical Exam  Nursing note and vitals reviewed. Constitutional: She is oriented to person, place, and time. She appears well-developed and well-nourished. No distress.  HENT:  Head: Normocephalic and atraumatic.  Right Ear: External ear normal.  Left Ear: External ear normal.  Nose: Nose normal.  Mouth/Throat: Oropharynx is clear and moist. No oropharyngeal exudate.  Tympanic membranes gray and translucent bilaterally with no erythema, edema, or hemotympanum.  No mastoid or tragal tenderness bilaterally. No erythema to the posterior pharynx. Tonsils without edema or exudates. Uvula midline. No trismus. No difficulty controlling secretions.   Eyes: EOM are normal. Pupils are equal, round, and reactive to light. Right eye exhibits no discharge, no exudate and no hordeolum. No foreign body present in the right eye.  Right conjunctiva is injected. Right conjunctiva has no hemorrhage. Right eye exhibits normal extraocular motion.  Fundoscopic exam:      The right eye shows no exudate.  Slit lamp exam:      The right eye shows corneal abrasion and fluorescein uptake.    Visual Acuity Right Eye Distance: 20/20   Left Eye : 20/30   Bilateral Distance: 20/20  Pinpoint corneal abrasion to the  LLQ of the eye. No corneal ulcer. 3-4 linear areas of uptake in the inferior portion of the eye. Right eye injected throughout. No foreign bodies. No edema or masses to the eyelids. No pain with eye movement. No eye drainage or discharge.    Neck: Neck supple.  No cervical lymphadenopathy. No nuchal rigidity.   Cardiovascular: Normal rate.   Pulmonary/Chest: Effort normal. No respiratory distress.  Musculoskeletal: Normal range of motion.  Neurological: She is alert and oriented to person, place, and time.  Skin: Skin is warm and dry. She is not diaphoretic.  Psychiatric: She has a normal mood and affect. Her behavior is normal.    ED Course  Procedures (including critical care time) COORDINATION OF CARE:  Nursing notes reviewed. Vital signs reviewed. Initial pt interview and examination performed.   Filed Vitals:   07/06/13 0935  BP: 120/75  Pulse: 83  Temp: 98.5 F (36.9 C)  TempSrc: Oral  Resp: 16  SpO2: 100%    9:44 AM-Discussed work up plan with pt at bedside, which includes  Orders Placed This Encounter  Procedures  . Visual acuity screening    Standing Status: Standing     Number of Occurrences: 1     Standing Expiration Date:   . Pt agrees with plan.   Treatment plan initiated:Medications - No data to display   Initial diagnostic testing ordered.    Labs Review Labs Reviewed - No data to display  Imaging Review No results found.   EKG Interpretation None      MDM   Michelle Frey is a 28 y.o. female who presents to the Emergency Department complaining of constant right eye pain onset this morning. Etiology of symptoms likely due to a corneal abrasion. No foreign body or eye trauma. Visual acuity intact. Patient afebrile and non-toxic in appearance. No signs or symptoms periorbital cellulitis. Encouraged to stop using contact lenses until evaluated by doctor. Has glasses at home. Will prescribe polytrim. Follow-up sometime this week. Return precautions,  discharge instructions, and follow-up was discussed with the patient before discharge.     Discharge Medication List as of 07/06/2013 10:38 AM    START taking these medications   Details  trimethoprim-polymyxin b (POLYTRIM) ophthalmic solution Place 1 drop into the right eye every 4 (four) hours., Starting 07/06/2013, Last dose on Sun 07/13/13, Print         Final impressions: 1. Corneal abrasion, right, initial encounter      Luiz IronJessica Katlin Palmer PA-C   I personally performed the services described in this documentation, which was scribed in my presence. The recorded information has been reviewed and is accurate.        Jillyn LedgerJessica K Palmer, PA-C 07/06/13 1453

## 2013-07-06 NOTE — ED Notes (Signed)
Pt reports waking up this am with irritation and pain to right eye. "feels like something is in her eye." pt rinsed eye pta with no relief.

## 2013-07-06 NOTE — ED Notes (Signed)
The patient states she usually wears glasses.   Left Eye 20/30   Right Eye 20/20   Both Eyes 20/20

## 2013-07-07 NOTE — ED Provider Notes (Signed)
Medical screening examination/treatment/procedure(s) were performed by non-physician practitioner and as supervising physician I was immediately available for consultation/collaboration.   EKG Interpretation None        Udell Blasingame J. Monet North, MD 07/07/13 1623 

## 2014-04-23 ENCOUNTER — Emergency Department (HOSPITAL_COMMUNITY)
Admission: EM | Admit: 2014-04-23 | Discharge: 2014-04-23 | Disposition: A | Discharge status: Home / Self Care | Payer: BLUE CROSS/BLUE SHIELD | Attending: Emergency Medicine | Admitting: Emergency Medicine

## 2014-04-23 ENCOUNTER — Encounter (HOSPITAL_COMMUNITY): Payer: Self-pay

## 2014-04-23 DIAGNOSIS — J069 Acute upper respiratory infection, unspecified: Secondary | ICD-10-CM

## 2014-04-23 DIAGNOSIS — Z79899 Other long term (current) drug therapy: Secondary | ICD-10-CM | POA: Diagnosis not present

## 2014-04-23 DIAGNOSIS — R05 Cough: Secondary | ICD-10-CM | POA: Diagnosis present

## 2014-04-23 DIAGNOSIS — Z792 Long term (current) use of antibiotics: Secondary | ICD-10-CM | POA: Diagnosis not present

## 2014-04-23 DIAGNOSIS — Z880 Allergy status to penicillin: Secondary | ICD-10-CM | POA: Diagnosis not present

## 2014-04-23 NOTE — Discharge Instructions (Signed)
Upper Respiratory Infection, Adult An upper respiratory infection (URI) is also sometimes known as the common cold. The upper respiratory tract includes the nose, sinuses, throat, trachea, and bronchi. Bronchi are the airways leading to the lungs. Most people improve within 1 week, but symptoms can last up to 2 weeks. A residual cough may last even longer.  CAUSES Many different viruses can infect the tissues lining the upper respiratory tract. The tissues become irritated and inflamed and often become very moist. Mucus production is also common. A cold is contagious. You can easily spread the virus to others by oral contact. This includes kissing, sharing a glass, coughing, or sneezing. Touching your mouth or nose and then touching a surface, which is then touched by another person, can also spread the virus. SYMPTOMS  Symptoms typically develop 1 to 3 days after you come in contact with a cold virus. Symptoms vary from person to person. They may include:  Runny nose.  Sneezing.  Nasal congestion.  Sinus irritation.  Sore throat.  Loss of voice (laryngitis).  Cough.  Fatigue.  Muscle aches.  Loss of appetite.  Headache.  Low-grade fever. DIAGNOSIS  You might diagnose your own cold based on familiar symptoms, since most people get a cold 2 to 3 times a year. Your caregiver can confirm this based on your exam. Most importantly, your caregiver can check that your symptoms are not due to another disease such as strep throat, sinusitis, pneumonia, asthma, or epiglottitis. Blood tests, throat tests, and X-rays are not necessary to diagnose a common cold, but they may sometimes be helpful in excluding other more serious diseases. Your caregiver will decide if any further tests are required. RISKS AND COMPLICATIONS  You may be at risk for a more severe case of the common cold if you smoke cigarettes, have chronic heart disease (such as heart failure) or lung disease (such as asthma), or if  you have a weakened immune system. The very young and very old are also at risk for more serious infections. Bacterial sinusitis, middle ear infections, and bacterial pneumonia can complicate the common cold. The common cold can worsen asthma and chronic obstructive pulmonary disease (COPD). Sometimes, these complications can require emergency medical care and may be life-threatening. PREVENTION  The best way to protect against getting a cold is to practice good hygiene. Avoid oral or hand contact with people with cold symptoms. Wash your hands often if contact occurs. There is no clear evidence that vitamin C, vitamin E, echinacea, or exercise reduces the chance of developing a cold. However, it is always recommended to get plenty of rest and practice good nutrition. TREATMENT  Treatment is directed at relieving symptoms. There is no cure. Antibiotics are not effective, because the infection is caused by a virus, not by bacteria. Treatment may include:  Increased fluid intake. Sports drinks offer valuable electrolytes, sugars, and fluids.  Breathing heated mist or steam (vaporizer or shower).  Eating chicken soup or other clear broths, and maintaining good nutrition.  Getting plenty of rest.  Using gargles or lozenges for comfort.  Controlling fevers with ibuprofen or acetaminophen as directed by your caregiver.  Increasing usage of your inhaler if you have asthma. Zinc gel and zinc lozenges, taken in the first 24 hours of the common cold, can shorten the duration and lessen the severity of symptoms. Pain medicines may help with fever, muscle aches, and throat pain. A variety of non-prescription medicines are available to treat congestion and runny nose. Your caregiver   can make recommendations and may suggest nasal or lung inhalers for other symptoms.  HOME CARE INSTRUCTIONS   Only take over-the-counter or prescription medicines for pain, discomfort, or fever as directed by your  caregiver.  Use a warm mist humidifier or inhale steam from a shower to increase air moisture. This may keep secretions moist and make it easier to breathe.  Drink enough water and fluids to keep your urine clear or pale yellow.  Rest as needed.  Return to work when your temperature has returned to normal or as your caregiver advises. You may need to stay home longer to avoid infecting others. You can also use a face mask and careful hand washing to prevent spread of the virus. SEEK MEDICAL CARE IF:   After the first few days, you feel you are getting worse rather than better.  You need your caregiver's advice about medicines to control symptoms.  You develop chills, worsening shortness of breath, or brown or red sputum. These may be signs of pneumonia.  You develop yellow or brown nasal discharge or pain in the face, especially when you bend forward. These may be signs of sinusitis.  You develop a fever, swollen neck glands, pain with swallowing, or white areas in the back of your throat. These may be signs of strep throat. SEEK IMMEDIATE MEDICAL CARE IF:   You have a fever.  You develop severe or persistent headache, ear pain, sinus pain, or chest pain.  You develop wheezing, a prolonged cough, cough up blood, or have a change in your usual mucus (if you have chronic lung disease).  You develop sore muscles or a stiff neck. Document Released: 06/21/2000 Document Revised: 03/20/2011 Document Reviewed: 04/02/2013 Exit[employer] Patient Information 2015 [employer]. This information is not intended to replace advice given to you by your health care provider. Make sure you discuss any questions you have with your health care provider.  

## 2014-04-23 NOTE — ED Provider Notes (Signed)
CSN: [id number]     Arrival date & time 04/23/14  [id number] History   First MD Initiated Contact with Patient 04/23/14 [phone]     Chief Complaint  Patient presents with  . Chest congestion   . Cough     (Consider location/radiation/quality/duration/timing/severity/associated sxs/prior Treatment) Patient is a 29 y.o. female presenting with URI.  URI Presenting symptoms: congestion, cough and fever   Severity:  Moderate Onset quality:  Gradual Duration:  2 days Timing:  Constant Progression:  Worsening Chronicity:  New Relieved by: mucinex PM. Worsened by:  Nothing tried Associated symptoms: headaches and sinus pain     History reviewed. No pertinent past medical history. History reviewed. No pertinent past surgical history. History reviewed. No pertinent family history. History  Substance Use Topics  . Smoking status: Never Smoker   . Smokeless tobacco: Not on file  . Alcohol Use: Yes     Comment: rarely   OB History    No data available     Review of Systems  Constitutional: Positive for fever.  HENT: Positive for congestion.   Respiratory: Positive for cough.   Neurological: Positive for headaches.  All other systems reviewed and are negative.     Allergies  Amoxicillin and Penicillins  Home Medications   Prior to Admission medications   Medication Sig Start Date End Date Taking? Authorizing Provider  Dapsone (ACZONE) 5 % topical gel Apply 1 application topically 2 (two) times daily.    Historical Provider, MD  doxycycline (VIBRA-TABS) 100 MG tablet Take 100 mg by mouth daily.    Historical Provider, MD  levonorgestrel-ethinyl estradiol (AVIANE,ALESSE,LESSINA) 0.1-20 MG-MCG tablet Take 1 tablet by mouth daily.    Historical Provider, MD  Multiple Vitamin (MULTIVITAMIN WITH MINERALS) TABS Take 1 tablet by mouth daily.    Historical Provider, MD  OVER THE COUNTER MEDICATION Take 2-3 tablets by mouth 2 (two) times daily. Take 3 tablets in the morning and 2 in the  afternoon.SUPER HD DIETARY SUPPLEMENT    Historical Provider, MD   BP 137/81 mmHg  Pulse 99  Temp(Src) 98.2 F (36.8 C) (Oral)  Resp 14  Ht  (1.575 m)  Wt 170 lb (77.111 kg)  BMI 31.09 kg/m2  SpO2 100%  LMP 03/23/2014 Physical Exam  Constitutional: She is oriented to person, place, and time. She appears well-developed and well-nourished. No distress.  HENT:  Head: Normocephalic and atraumatic.  Nose: Mucosal edema present. Right sinus exhibits frontal sinus tenderness. Right sinus exhibits no maxillary sinus tenderness. Left sinus exhibits frontal sinus tenderness. Left sinus exhibits no maxillary sinus tenderness.  Eyes: Conjunctivae are normal. No scleral icterus.  Neck: Neck supple.  Cardiovascular: Normal rate, regular rhythm, normal heart sounds and intact distal pulses.   No murmur heard. Pulmonary/Chest: Effort normal and breath sounds normal. No stridor. No respiratory distress. She has no decreased breath sounds. She has no wheezes. She has no rhonchi. She has no rales.  Abdominal: Normal appearance. She exhibits no distension.  Neurological: She is alert and oriented to person, place, and time.  Skin: Skin is warm and dry. No rash noted.  Psychiatric: She has a normal mood and affect. Her behavior is normal.  Nursing note and vitals reviewed.   ED Course  Procedures (including critical care time) Labs Review Labs Reviewed - No data to display  Imaging Review No results found.   EKG Interpretation None      MDM   Final diagnoses:  Acute URI    29 yo  female with URI symptoms with cough.  Son sick at home with similar symptoms.  Well appearing, nontoxic.  Has CP when coughing, never without coughing.  Vitals stable.  URI appears uncomplicated.  Advised supportive treatment.      Blake DivineJohn Parag Dorton, MD 04/23/14 1012

## 2014-04-23 NOTE — ED Notes (Signed)
Pt alert x4 respirations easy non labored.  

## 2014-04-23 NOTE — ED Notes (Signed)
Pt c/o chest congestion and productive cough x 2 days.  Sts chest pain only w/ coughing.  Pain score 8/10.  Pt reports taking OTC medication.

## 2016-08-14 ENCOUNTER — Ambulatory Visit (HOSPITAL_COMMUNITY)
Admission: EM | Admit: 2016-08-14 | Discharge: 2016-08-14 | Disposition: A | Discharge status: Home / Self Care | Payer: Medicaid Other | Attending: Internal Medicine | Admitting: Internal Medicine

## 2016-08-14 ENCOUNTER — Encounter (HOSPITAL_COMMUNITY): Payer: Self-pay | Admitting: Emergency Medicine

## 2016-08-14 DIAGNOSIS — N3090 Cystitis, unspecified without hematuria: Secondary | ICD-10-CM | POA: Insufficient documentation

## 2016-08-14 DIAGNOSIS — N898 Other specified noninflammatory disorders of vagina: Secondary | ICD-10-CM | POA: Diagnosis not present

## 2016-08-14 DIAGNOSIS — Z79899 Other long term (current) drug therapy: Secondary | ICD-10-CM | POA: Insufficient documentation

## 2016-08-14 DIAGNOSIS — Z880 Allergy status to penicillin: Secondary | ICD-10-CM | POA: Insufficient documentation

## 2016-08-14 LAB — POCT URINALYSIS DIP (DEVICE)
BILIRUBIN URINE: NEGATIVE
Glucose, UA: NEGATIVE mg/dL
HGB URINE DIPSTICK: NEGATIVE
NITRITE: NEGATIVE
PH: 6.5 (ref 5.0–8.0)
Protein, ur: NEGATIVE mg/dL
Specific Gravity, Urine: 1.015 (ref 1.005–1.030)
UROBILINOGEN UA: 1 mg/dL (ref 0.0–1.0)

## 2016-08-14 LAB — POCT PREGNANCY, URINE: Preg Test, Ur: NEGATIVE

## 2016-08-14 MED ORDER — SULFAMETHOXAZOLE-TRIMETHOPRIM 800-160 MG PO TABS: 1.0000 | ORAL_TABLET | Freq: Two times a day (BID) | ORAL | 0 refills | Status: AC

## 2016-08-14 MED ORDER — FLUCONAZOLE 150 MG PO TABS: 150.0000 mg | ORAL_TABLET | Freq: Every day | ORAL | 0 refills | Status: DC

## 2016-08-14 NOTE — Discharge Instructions (Signed)
Your urine was positive for UTI. Vaginal discharge consistent with yeast infection. Start Bactrim for UTI as directed. Diflucan for yeast infection as directed. Cytology was sent for testing as well as urine culture, you will be contacted with any positive results that required further treatment. Monitor for worsening of symptoms, abdominal pain, nausea, vomiting, fever, follow-up for reevaluation.

## 2016-08-14 NOTE — ED Provider Notes (Signed)
CSN: [id number]     Arrival date & time 08/14/16  1809 History   None    Chief Complaint  Patient presents with  . Vaginal Itching   (Consider location/radiation/quality/duration/timing/severity/associated sxs/prior Treatment) 31 year old female comes in for 4 days of vaginal itching. She has noticed some cottage cheese like discharge. Has had some urinary frequency, denies dysuria, hematuria. Denies abdominal pain, nausea, vomiting, diarrhea, constipation. She is about to start her cycle, and states symptoms could be due to that as well. Denies fever, chills, night sweats. She is sexually active, with a new partner, no condom use. Denies vaginal pain, spotting.      History reviewed. No pertinent past medical history. History reviewed. No pertinent surgical history. No family history on file. Social History  Substance Use Topics  . Smoking status: Never Smoker  . Smokeless tobacco: Not on file  . Alcohol use Yes     Comment: rarely   OB History    No data available     Review of Systems  Reason unable to perform ROS: See HPI as above.    Allergies  Amoxicillin and Penicillins  Home Medications   Prior to Admission medications   Medication Sig Start Date End Date Taking? Authorizing Provider  OVER THE COUNTER MEDICATION hydroxycut   Yes [provider]  fluconazole (DIFLUCAN) 150 MG tablet Take 1 tablet (150 mg total) by mouth daily. Take second dose 72 hours later if symptoms still persists. 08/14/16   Belinda FisherYu, Cianna Kasparian V, PA-C  levonorgestrel-ethinyl estradiol (AVIANE,ALESSE,LESSINA) 0.1-20 MG-MCG tablet Take 1 tablet by mouth daily.    [provider]  Multiple Vitamin (MULTIVITAMIN WITH MINERALS) TABS Take 1 tablet by mouth daily.    [provider]  OVER THE COUNTER MEDICATION Take 2-3 tablets by mouth 2 (two) times daily. Take 3 tablets in the morning and 2 in the afternoon.SUPER HD DIETARY SUPPLEMENT    [provider]   sulfamethoxazole-trimethoprim (BACTRIM DS,SEPTRA DS) 800-160 MG tablet Take 1 tablet by mouth 2 (two) times daily. 08/14/16 08/17/16  Belinda FisherYu, Satine Hausner V, PA-C   Meds Ordered and Administered this Visit  Medications - No data to display  BP 128/70 (BP Location: Left Arm)   Pulse 83   Temp 98.4 F (36.9 C) (Oral)   Resp 18   LMP 07/19/2016   SpO2 97%  No data found.   Physical Exam  Constitutional: She is oriented to person, place, and time. She appears well-developed and well-nourished. No distress.  HENT:  Head: Normocephalic and atraumatic.  Eyes: Pupils are equal, round, and reactive to light. Conjunctivae are normal.  Cardiovascular: Normal rate, regular rhythm and normal heart sounds.  Exam reveals no gallop and no friction rub.   No murmur heard. Pulmonary/Chest: Effort normal and breath sounds normal. She has no wheezes. She has no rales.  Abdominal: Soft. Bowel sounds are normal. She exhibits no mass. There is no tenderness. There is no rebound, no guarding and no CVA tenderness.  Genitourinary:  Genitourinary Comments: Deferred  Neurological: She is alert and oriented to person, place, and time.  Skin: Skin is warm and dry.  Psychiatric: She has a normal mood and affect. Her behavior is normal. Judgment normal.    Urgent Care Course     Procedures (including critical care time)  Labs Review Labs Reviewed  POCT URINALYSIS DIP (DEVICE) - Abnormal; Notable for the following:       Result Value   Ketones, ur TRACE (*)    Leukocytes, UA  MODERATE (*)    All other components within normal limits  URINE CULTURE  POCT PREGNANCY, URINE  CERVICOVAGINAL ANCILLARY ONLY    Imaging Review No results found.    MDM   1. Vaginal discharge   2. Cystitis    Discussed lab results with patient, urine positive for UTI. Start Bactrim. Vaginal discharge description consistent with yeast, will treat empirically. Culture and cytology sent, patient will be contacted with any positive  results that require further treatment. Monitor for worsening of symptoms, abdominal pain, nausea, vomiting, fever, follow up for reevaluation.    Belinda Fisher, PA-C 08/14/16 2147

## 2016-08-14 NOTE — ED Triage Notes (Signed)
Patient reports vaginal itching for 4 days.  Patient reports no odor.  Reports there is a discharge, but she is getting ready to start her period.  Patient does have a new sexual partner

## 2016-08-14 NOTE — ED Notes (Signed)
Sent patient to the bathroom for dirty and clean urine with instructions

## 2016-08-15 LAB — CERVICOVAGINAL ANCILLARY ONLY
BACTERIAL VAGINITIS: NEGATIVE
Candida vaginitis: POSITIVE — AB
Chlamydia: NEGATIVE
NEISSERIA GONORRHEA: NEGATIVE
TRICH (WINDOWPATH): NEGATIVE

## 2016-08-16 LAB — URINE CULTURE

## 2016-12-29 ENCOUNTER — Encounter (HOSPITAL_COMMUNITY): Payer: Self-pay

## 2016-12-29 ENCOUNTER — Other Ambulatory Visit: Payer: Self-pay

## 2016-12-29 ENCOUNTER — Emergency Department (HOSPITAL_COMMUNITY)
Admission: EM | Admit: 2016-12-29 | Discharge: 2016-12-29 | Disposition: A | Discharge status: Home / Self Care | Payer: Medicaid Other | Attending: Emergency Medicine | Admitting: Emergency Medicine

## 2016-12-29 ENCOUNTER — Emergency Department (HOSPITAL_COMMUNITY): Payer: Medicaid Other

## 2016-12-29 DIAGNOSIS — J029 Acute pharyngitis, unspecified: Secondary | ICD-10-CM | POA: Insufficient documentation

## 2016-12-29 DIAGNOSIS — R0789 Other chest pain: Secondary | ICD-10-CM | POA: Insufficient documentation

## 2016-12-29 DIAGNOSIS — M7918 Myalgia, other site: Secondary | ICD-10-CM | POA: Insufficient documentation

## 2016-12-29 DIAGNOSIS — R69 Illness, unspecified: Secondary | ICD-10-CM

## 2016-12-29 DIAGNOSIS — Z79899 Other long term (current) drug therapy: Secondary | ICD-10-CM | POA: Insufficient documentation

## 2016-12-29 DIAGNOSIS — J111 Influenza due to unidentified influenza virus with other respiratory manifestations: Secondary | ICD-10-CM

## 2016-12-29 NOTE — ED Provider Notes (Signed)
[facility] Provider Note   CSN: [id number] Arrival date & time: 12/29/16  0756     History   Chief Complaint Chief Complaint  Patient presents with  . URI    HPI   Blood pressure 122/89, pulse (!) 103, temperature 99.3 F (37.4 C), temperature source Oral, resp. rate 16, last menstrual period 12/12/2016, SpO2 100 %.  Michelle Frey is a 31 y.o. female complaining of sinus pressure and pain with low-grade temperature (T-max 99) productive cough with pleuritic chest pain, rhinorrhea, sore throat and myalgia onset 4 days ago.  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  She denies any headache, cervicalgia, rash, shortness of breath, abdominal pain, nausea vomiting, change in bowel or bladder habits.  No other comorbidities  History reviewed. No pertinent past medical history.  There are no active problems to display for this patient.   History reviewed. No pertinent surgical history.  OB History    No data available       Home Medications    Prior to Admission medications   Medication Sig Start Date End Date Taking? Authorizing Provider  fluconazole (DIFLUCAN) 150 MG tablet Take 1 tablet (150 mg total) by mouth daily. Take second dose 72 hours later if symptoms still persists. 08/14/16   Belinda FisherYu, Amy V, PA-C  levonorgestrel-ethinyl estradiol (AVIANE,ALESSE,LESSINA) 0.1-20 MG-MCG tablet Take 1 tablet by mouth daily.    [provider]  Multiple Vitamin (MULTIVITAMIN WITH MINERALS) TABS Take 1 tablet by mouth daily.    [provider]  OVER THE COUNTER MEDICATION Take 2-3 tablets by mouth 2 (two) times daily. Take 3 tablets in the morning and 2 in the afternoon.SUPER HD DIETARY SUPPLEMENT    [provider]  OVER THE COUNTER MEDICATION hydroxycut    [provider]    Family History History reviewed. No pertinent family history.  Social  History Social History   Tobacco Use  . Smoking status: Never Smoker  . Smokeless tobacco: Never Used  Substance Use Topics  . Alcohol use: Yes    Comment: rarely  . Drug use: No     Allergies   Amoxicillin and Penicillins   Review of Systems Review of Systems   A complete review of systems was obtained and all systems are negative except as noted in the HPI and PMH.    Physical Exam Updated Vital Signs BP 120/81 (BP Location: Right Arm)   Pulse 94   Temp 98.9 F (37.2 C) (Oral)   Resp 16   LMP 12/12/2016   SpO2 100%   Physical Exam  Constitutional: She appears well-developed and well-nourished.  HENT:  Head: Normocephalic.  Right Ear: External ear normal.  Left Ear: External ear normal.  Mouth/Throat: Oropharynx is clear and moist. No oropharyngeal exudate.  No drooling or stridor. Posterior pharynx mildly erythematous no significant tonsillar hypertrophy. No exudate. Soft palate rises symmetrically. No TTP or induration under tongue.   No tenderness to palpation of frontal or bilateral maxillary sinuses.  Mild mucosal edema in the nares with scant rhinorrhea.  Bilateral tympanic membranes with normal architecture and good light reflex.    Eyes: Conjunctivae and EOM are normal. Pupils are equal, round, and reactive to light.  Neck: Normal range of motion. Neck supple.  Cardiovascular: Normal rate and regular rhythm.  Pulmonary/Chest: Effort normal and breath sounds normal. No stridor. No respiratory distress. She has no wheezes.  She has no rales. She exhibits no tenderness.  Abdominal: Soft. There is no tenderness. There is no rebound and no guarding.  Nursing note and vitals reviewed.    ED Treatments / Results  Labs (all labs ordered are listed, but only abnormal results are displayed) Labs Reviewed - No data to display  EKG  EKG Interpretation None       Radiology Dg Chest 2 View  Result Date: 12/29/2016 CLINICAL DATA:  Cough, midline  chest pain EXAM: CHEST  2 VIEW COMPARISON:  None. FINDINGS: Heart and mediastinal contours are within normal limits. No focal opacities or effusions. No acute bony abnormality. IMPRESSION: No active cardiopulmonary disease. Electronically Signed   By: Charlett NoseKevin  Dover M.D.   On: 12/29/2016 09:11    Procedures Procedures (including critical care time)  Medications Ordered in ED Medications - No data to display   Initial Impression / Assessment and Plan / ED Course  I have reviewed the triage vital signs and the nursing notes.  Pertinent labs & imaging results that were available during my care of the patient were reviewed by me and considered in my medical decision making (see chart for details).     Vitals:   12/29/16 0813 12/29/16 0913  BP: 122/89 120/81  Pulse: (!) 103 94  Resp: 16 16  Temp: 99.3 F (37.4 C) 98.9 F (37.2 C)  TempSrc: Oral Oral  SpO2: 100% 100%     Michelle Frey is 31 y.o. female presenting with NAD, Non-toxic appearing, AFVSS, LSCTA. Likely viral illness, advised patient to push fluids, over-the-counter medications for symptom relief.  Evaluation does not show pathology that would require ongoing emergent intervention or inpatient treatment. Pt is hemodynamically stable and mentating appropriately. Discussed findings and plan with patient/guardian, who agrees with care plan. All questions answered. Return precautions discussed and outpatient follow up given.      Final Clinical Impressions(s) / ED Diagnoses   Final diagnoses:  Influenza-like illness    ED Discharge Orders    None       Lynetta Mareisciotta, Mardella Laymanicole, PA-C 12/29/16 1025    Benjiman CorePickering, Nathan, MD 12/29/16 1550

## 2016-12-29 NOTE — ED Triage Notes (Signed)
Pt states she had sinus congestion on Wednesday. Thursday she began having fever, nasal congestion, and cough. Pt states some mucous when coughing. Afebrile in triage.

## 2016-12-29 NOTE — ED Notes (Signed)
Pt taken to xray 

## 2016-12-29 NOTE — Discharge Instructions (Signed)
Return to the emergency room for any worsening or concerning symptoms including fast breathing, heart racing, confusion, vomiting. ° °Rest, cover your mouth when you cough and wash your hands frequently.  ° °Push fluids: water or Gatorade, do not drink any soda, juice or caffeinated beverages. ° °For fever and pain control you can take Motrin (ibuprofen) as follows: 400 mg (this is normally 2 over the counter pills) every 4 hours with food. ° °Do not return to work until a day after your fever breaks.  ° ° ° °

## 2018-10-29 ENCOUNTER — Other Ambulatory Visit: Payer: Self-pay | Admitting: Registered"

## 2018-10-29 ENCOUNTER — Ambulatory Visit: Payer: Self-pay | Admitting: *Deleted

## 2018-10-29 DIAGNOSIS — Z20822 Contact with and (suspected) exposure to covid-19: Secondary | ICD-10-CM

## 2018-10-29 DIAGNOSIS — Z20828 Contact with and (suspected) exposure to other viral communicable diseases: Secondary | ICD-10-CM | POA: Diagnosis not present

## 2018-10-29 NOTE — Telephone Encounter (Addendum)
Patient without a pcp calls with temperature as of this morning, 100.1 oral. She has taken cold and flu OTC medication. Also has tenderness under both ears near the glands and achy legs. Reviewed care advice including tylenol or advil for fever/body aches, warm salt gargles/warm tea with honey for throat area.Increase daily water intake.  Recommended [phone] testing due to her symptoms as well.If other symptoms arise/fever persist today please begin quarantine until test results are called to you.  Wear mask at all times around others, social distance. If no improvement or worsens, please call back. Patient wanted to establish with a pcp, transferred to [facility] for new patient appointment. Requested acute visit, none available.   Reason for Disposition . [1] Fever AND [2] no signs of serious infection or localizing symptoms (all other triage questions negative)  Answer Assessment - Initial Assessment Questions 1. TEMPERATURE: "What is the most recent temperature?"  "How was it measured?"      100.1 oral about an hour ago took cold and flu medicine now. 2. ONSET: "When did the fever start?"      This morning 3. SYMPTOMS: "Do you have any other symptoms besides the fever?"  (e.g., colds, headache, sore throat, earache, cough, rash, diarrhea, vomiting, abdominal pain)     Below the ears near the glands. Tender to touch. White spots on left tonsil 4. CAUSE: If there are no symptoms, ask: "What do you think is causing the fever?"      No idea 5. CONTnone 6. TREATMENT: "What have you done so far to treat this fever?" (e.g., medications)     Cold medicine. 7. IMMUNOCOMPROMISE: "Do you have of the following: diabetes, HIV positive, splenectomy, cancer chemotherapy, chronic steroid treatment, transplant patient, etc."     none 8. PREGNANCY: "Is there any chance you are pregnant?"no 9. TRAVEL: "Have you traveled out of the country in the last month?" (e.g., travel history, exposures)    no  Protocols  used: FEVER-A-AH

## 2018-10-30 LAB — NOVEL CORONAVIRUS, NAA: SARS-CoV-2, NAA: NOT DETECTED

## 2018-11-04 ENCOUNTER — Ambulatory Visit: Payer: Self-pay | Admitting: Registered Nurse

## 2018-11-05 ENCOUNTER — Encounter: Payer: Self-pay | Admitting: Registered Nurse

## 2018-11-05 ENCOUNTER — Other Ambulatory Visit: Payer: Self-pay

## 2018-11-05 ENCOUNTER — Ambulatory Visit (INDEPENDENT_AMBULATORY_CARE_PROVIDER_SITE_OTHER): Payer: BC Managed Care – PPO | Admitting: Registered Nurse

## 2018-11-05 VITALS — BP 122/82 | HR 98 | Temp 98.0°F | Resp 12 | Ht 63.0 in | Wt 173.0 lb

## 2018-11-05 DIAGNOSIS — Z13220 Encounter for screening for lipoid disorders: Secondary | ICD-10-CM | POA: Diagnosis not present

## 2018-11-05 DIAGNOSIS — Z13228 Encounter for screening for other metabolic disorders: Secondary | ICD-10-CM | POA: Diagnosis not present

## 2018-11-05 DIAGNOSIS — Z1329 Encounter for screening for other suspected endocrine disorder: Secondary | ICD-10-CM | POA: Diagnosis not present

## 2018-11-05 DIAGNOSIS — Z130 Encounter for screening for diseases of the blood and blood-forming organs and certain disorders involving the immune mechanism: Secondary | ICD-10-CM | POA: Diagnosis not present

## 2018-11-05 DIAGNOSIS — K625 Hemorrhage of anus and rectum: Secondary | ICD-10-CM

## 2018-11-05 DIAGNOSIS — Z113 Encounter for screening for infections with a predominantly sexual mode of transmission: Secondary | ICD-10-CM

## 2018-11-05 NOTE — Progress Notes (Signed)
Established Patient Office Visit  Subjective:  Patient ID: Michelle Frey, female    DOB: 02/08/1985  Age: 33 y.o. MRN: [id number]  CC:  Chief Complaint  Patient presents with  . New Patient (Initial Visit)    Pt stated seeing GYN    HPI Michelle Frey presents for visit to establish care. Has a number of concerns today:  Recent URI, not positive for COVID. Resolved on it's own. Wanted to discuss pathophysiology of URI, how this resolved so quickly, should she be concerned for recurrence, etc.  Recurrent BV/candidal infections. Usually happening prior to her menses. Has happened last 4 months. Has been treated successfully each time, but she admits to not finishing course of metronidazole. Notes that she does use fragrance soap to clean genitals. Otherwise, avoids tight clothing while sleeping, does not douche, no changes in detergent. Has had new partner recently, will rtc for STD screen  Finally, concern for longstanding constipation. At times, no bowel movements for 2.5 weeks. Has taken laxatives with some effect. Has noted a history of BRBPR. Denies melena. One instance of hematochezia after "long night of drinking". Was told in past she would need a colonoscopy but unfortunately did not follow up - is interested in GI referral today.  History reviewed. No pertinent past medical history.  History reviewed. No pertinent surgical history.  Family History  Problem Relation Age of Onset  . Hyperlipidemia Mother   . Pancreatic cancer Father   . Alzheimer's disease Maternal Grandmother     Social History   Socioeconomic History  . Marital status: Divorced    Spouse name: Not on file  . Number of children: 1  . Years of education: Not on file  . Highest education level: Not on file  Occupational History  . Occupation: Presenter, [employer]  Social Needs  . Financial resource strain: Not hard at all  . Food insecurity    Worry: Never true    Inability: Never true  . Transportation  needs    Medical: No    Non-medical: No  Tobacco Use  . Smoking status: Never Smoker  . Smokeless tobacco: Never Used  Substance and Sexual Activity  . Alcohol use: Yes    Comment: rarely  . Drug use: No  . Sexual activity: Yes    Birth control/protection: Pill  Lifestyle  . Physical activity    Days per week: 3 days    Minutes per session: 30 min  . Stress: Only a little  Relationships  . Social Musicianconnections    Talks on phone: Three times a week    Gets together: Twice a week    Attends religious service: Patient refused    Active member of club or organization: Patient refused    Attends meetings of clubs or organizations: Patient refused    Relationship status: Divorced  . Intimate partner violence    Fear of current or ex partner: No    Emotionally abused: No    Physically abused: No    Forced sexual activity: No  Other Topics Concern  . Not on file  Social History Narrative  . Not on file    Outpatient Medications Prior to Visit  Medication Sig Dispense Refill  . fluconazole (DIFLUCAN) 150 MG tablet Take 1 tablet (150 mg total) by mouth daily. Take second dose 72 hours later if symptoms still persists. 2 tablet 0  . levonorgestrel-ethinyl estradiol (AVIANE,ALESSE,LESSINA) 0.1-20 MG-MCG tablet Take 1 tablet by mouth daily.    . Multiple  Vitamin (MULTIVITAMIN WITH MINERALS) TABS Take 1 tablet by mouth daily.    Marland Kitchen OVER THE COUNTER MEDICATION Take 2-3 tablets by mouth 2 (two) times daily. Take 3 tablets in the morning and 2 in the afternoon.SUPER HD DIETARY SUPPLEMENT    . OVER THE COUNTER MEDICATION hydroxycut     No facility-administered medications prior to visit.     Allergies  Allergen Reactions  . Amoxicillin Diarrhea and Nausea And Vomiting  . Penicillins Diarrhea and Nausea And Vomiting    ROS Review of Systems  Constitutional: Negative.   HENT: Negative.   Eyes: Negative.   Respiratory: Negative.   Cardiovascular: Negative.   Gastrointestinal:  Negative.   Endocrine: Negative.   Genitourinary: Negative.   Musculoskeletal: Negative.   Skin: Negative.   Allergic/Immunologic: Negative.   Neurological: Negative.   Hematological: Negative.   Psychiatric/Behavioral: Negative.   All other systems reviewed and are negative.     Objective:    Physical Exam  Constitutional: She is oriented to person, place, and time. She appears well-developed and well-nourished. No distress.  Cardiovascular: Normal rate and regular rhythm.  Pulmonary/Chest: No respiratory distress.  Neurological: She is alert and oriented to person, place, and time.  Skin: Skin is warm and dry. No rash noted. She is not diaphoretic. No erythema. No pallor.  Psychiatric: She has a normal mood and affect. Her behavior is normal. Judgment and thought content normal.  Nursing note and vitals reviewed.   BP 122/82 (BP Location: Right Arm, Patient Position: Sitting, Cuff Size: Normal)   Pulse 98   Temp 98 F (36.7 C)   Resp 12   Ht 5\' 3"  (1.6 m)   Wt 173 lb (78.5 kg)   LMP 10/16/2018   SpO2 98%   BMI 30.65 kg/m  Wt Readings from Last 3 Encounters:  11/05/18 173 lb (78.5 kg)  04/23/14 170 lb (77.1 kg)     Health Maintenance Due  Topic Date Due  . HIV Screening  09/18/2002  . TETANUS/TDAP  09/18/2006  . PAP SMEAR-Modifier  09/17/2008    There are no preventive care reminders to display for this patient.  No results found for: TSH Lab Results  Component Value Date   WBC 15.6 (H) 10/18/2009   HGB 11.6 (L) 10/18/2009   HCT 34.3 (L) 10/18/2009   MCV 91.7 10/18/2009   PLT 195 10/18/2009   Lab Results  Component Value Date   NA 134 (L) 04/17/2009   K 3.5 04/17/2009   CO2 23 04/17/2009   GLUCOSE 78 04/17/2009   BUN 4 (L) 04/17/2009   CREATININE 0.43 04/17/2009   BILITOT 0.2 (L) 04/17/2009   ALKPHOS 30 (L) 04/17/2009   AST 13 04/17/2009   ALT 8 04/17/2009   PROT 6.5 04/17/2009   ALBUMIN 3.2 (L) 04/17/2009   CALCIUM 9.2 04/17/2009   No  results found for: CHOL No results found for: HDL No results found for: LDLCALC No results found for: TRIG No results found for: CHOLHDL No results found for: 06/17/2009    Assessment & Plan:   Problem List Items Addressed This Visit    None    Visit Diagnoses    Screening for endocrine, metabolic and immunity disorder    -  Primary   Relevant Orders   Comprehensive metabolic panel   Hemoglobin A1c   Lipid panel   CBC with Differential   TSH   Lipid screening       BRBPR (bright red blood per rectum)  Relevant Orders   CBC with Differential   Screen for STD (sexually transmitted disease)       Relevant Orders   Urine cytology ancillary only   HIV antibody (with reflex)   RPR      No orders of the defined types were placed in this encounter.   Follow-up: Return in about 2 weeks (around 11/19/2018) for STD screen lab only.    Maximiano Coss, NP

## 2018-11-06 ENCOUNTER — Encounter: Payer: Self-pay | Admitting: Registered Nurse

## 2018-11-06 LAB — COMPREHENSIVE METABOLIC PANEL
ALT: 24 IU/L (ref 0–32)
AST: 24 IU/L (ref 0–40)
Albumin/Globulin Ratio: 1.3 (ref 1.2–2.2)
Albumin: 4 g/dL (ref 3.8–4.8)
Alkaline Phosphatase: 45 IU/L (ref 39–117)
BUN/Creatinine Ratio: 7 — ABNORMAL LOW (ref 9–23)
BUN: 5 mg/dL — ABNORMAL LOW (ref 6–20)
Bilirubin Total: 0.4 mg/dL (ref 0.0–1.2)
CO2: 21 mmol/L (ref 20–29)
Calcium: 9.2 mg/dL (ref 8.7–10.2)
Chloride: 103 mmol/L (ref 96–106)
Creatinine, Ser: 0.68 mg/dL (ref 0.57–1.00)
GFR calc Af Amer: 135 mL/min/{1.73_m2} (ref >59)
GFR calc non Af Amer: 117 mL/min/{1.73_m2} (ref >59)
Globulin, Total: 3 g/dL (ref 1.5–4.5)
Glucose: 90 mg/dL (ref 65–99)
Potassium: 3.8 mmol/L (ref 3.5–5.2)
Sodium: 138 mmol/L (ref 134–144)
Total Protein: 7 g/dL (ref 6.0–8.5)

## 2018-11-06 LAB — LIPID PANEL
Chol/HDL Ratio: 3.4 ratio (ref 0.0–4.4)
Cholesterol, Total: 164 mg/dL (ref 100–199)
HDL: 48 mg/dL (ref >39)
LDL Chol Calc (NIH): 97 mg/dL (ref 0–99)
Triglycerides: 102 mg/dL (ref 0–149)
VLDL Cholesterol Cal: 19 mg/dL (ref 5–40)

## 2018-11-06 LAB — CBC WITH DIFFERENTIAL/PLATELET
Basophils Absolute: 0 10*3/uL (ref 0.0–0.2)
Basos: 0 %
EOS (ABSOLUTE): 0.1 10*3/uL (ref 0.0–0.4)
Eos: 2 %
Hematocrit: 37.1 % (ref 34.0–46.6)
Hemoglobin: 12 g/dL (ref 11.1–15.9)
Immature Grans (Abs): 0 10*3/uL (ref 0.0–0.1)
Immature Granulocytes: 0 %
Lymphocytes Absolute: 2.6 10*3/uL (ref 0.7–3.1)
Lymphs: 35 %
MCH: 28.2 pg (ref 26.6–33.0)
MCHC: 32.3 g/dL (ref 31.5–35.7)
MCV: 87 fL (ref 79–97)
Monocytes Absolute: 0.4 10*3/uL (ref 0.1–0.9)
Monocytes: 5 %
Neutrophils Absolute: 4.4 10*3/uL (ref 1.4–7.0)
Neutrophils: 58 %
Platelets: 429 10*3/uL (ref 150–450)
RBC: 4.26 x10E6/uL (ref 3.77–5.28)
RDW: 12.9 % (ref 11.7–15.4)
WBC: 7.5 10*3/uL (ref 3.4–10.8)

## 2018-11-06 LAB — HEMOGLOBIN A1C
Est. average glucose Bld gHb Est-mCnc: 105 mg/dL
Hgb A1c MFr Bld: 5.3 % (ref 4.8–5.6)

## 2018-11-06 LAB — TSH: TSH: 2.49 u[IU]/mL (ref 0.450–4.500)

## 2018-11-06 NOTE — Progress Notes (Signed)
Labs wnl No concerns Letter sent to pt via [place], NP

## 2018-12-30 DIAGNOSIS — Z114 Encounter for screening for human immunodeficiency virus [HIV]: Secondary | ICD-10-CM | POA: Diagnosis not present

## 2018-12-30 DIAGNOSIS — Z113 Encounter for screening for infections with a predominantly sexual mode of transmission: Secondary | ICD-10-CM | POA: Diagnosis not present

## 2019-02-04 ENCOUNTER — Encounter: Payer: Self-pay | Admitting: Registered Nurse

## 2019-02-04 ENCOUNTER — Telehealth (INDEPENDENT_AMBULATORY_CARE_PROVIDER_SITE_OTHER): Payer: BC Managed Care – PPO | Admitting: Registered Nurse

## 2019-02-04 ENCOUNTER — Other Ambulatory Visit: Payer: Self-pay

## 2019-02-04 DIAGNOSIS — R1114 Bilious vomiting: Secondary | ICD-10-CM

## 2019-02-04 MED ORDER — METOCLOPRAMIDE HCL 5 MG PO TBDP
5.0000 mg | ORAL_TABLET | Freq: Three times a day (TID) | ORAL | 0 refills | Status: DC
Start: 2019-02-04 — End: 2020-03-24

## 2019-02-04 NOTE — Progress Notes (Signed)
Patient states for the three week at first she was just experiencing problems with her stomach, then it was diarrhea, nausea, heartburn, and headaches. Per patient she took some Ibuprofen 800 mg for the headache a few times , and just thought it would get better but it didn't.

## 2019-02-04 NOTE — Progress Notes (Signed)
Telemedicine Encounter- SOAP NOTE Established Patient  This telephone encounter was conducted with the patient's (or proxy's) verbal consent via audio telecommunications: yes  Patient was instructed to have this encounter in a suitably private space; and to only have persons present to whom they give permission to participate. In addition, patient identity was confirmed by use of name plus two identifiers (DOB and address).  I discussed the limitations, risks, security and privacy concerns of performing an evaluation and management service by telephone and the availability of in person appointments. I also discussed with the patient that there may be a patient responsible charge related to this service. The patient expressed understanding and agreed to proceed.  I spent a total of 23 minutes talking with the patient or their proxy.  No chief complaint on file.   Subjective   Michelle Frey is a 34 y.o. established patient. Telephone visit today for ongoing nvd  HPI Onset 2-3 weeks ago with upset stomach. Over the following week, developed into abdominal pain, heartburn, nausea, vomiting and diarrhea.  Vomiting occurred about every other day. Often with nausea. Often on empty stomach d/t decreased appetite. Abdominal pain is lower abdomen, center. Not at all lateralized. Cramping pain - not dissimilar from menstrual cramping, but pt expects menses tomorrow, 02/05/19. Diarrhea - pt has been using restroom 10-20 times daily, sometimes mucus in stool. No sick contacts to her knowledge. Does not feel feverish, chills, myalgias.  States she took a home pregnancy test that showed a faint positive line, three others were negative. Reports good compliance with COCs.   There are no problems to display for this patient.   No past medical history on file.  Current Outpatient Medications  Medication Sig Dispense Refill  . levonorgestrel-ethinyl estradiol (AVIANE,ALESSE,LESSINA) 0.1-20 MG-MCG  tablet Take 1 tablet by mouth daily.    . Multiple Vitamin (MULTIVITAMIN WITH MINERALS) TABS Take 1 tablet by mouth daily.    . fluconazole (DIFLUCAN) 150 MG tablet Take 1 tablet (150 mg total) by mouth daily. Take second dose 72 hours later if symptoms still persists. (Patient not taking: Reported on 02/04/2019) 2 tablet 0   No current facility-administered medications for this visit.    Allergies  Allergen Reactions  . Amoxicillin Diarrhea and Nausea And Vomiting  . Penicillins Diarrhea and Nausea And Vomiting    Social History   Socioeconomic History  . Marital status: Divorced    Spouse name: Not on file  . Number of children: 1  . Years of education: Not on file  . Highest education level: Not on file  Occupational History  . Occupation: [employer]  Tobacco Use  . Smoking status: Never Smoker  . Smokeless tobacco: Never Used  Substance and Sexual Activity  . Alcohol use: Yes    Comment: rarely  . Drug use: No  . Sexual activity: Yes    Birth control/protection: Pill  Other Topics Concern  . Not on file  Social History Narrative  . Not on file   Social Determinants of Health   Financial Resource Strain: Low Risk   . Difficulty of Paying Living Expenses: Not hard at all  Food Insecurity: No Food Insecurity  . Worried About Charity fundraiser in the Last Year: Never true  . Ran Out of Food in the Last Year: Never true  Transportation Needs: No Transportation Needs  . Lack of Transportation (Medical): No  . Lack of Transportation (Non-Medical): No  Physical Activity: Insufficiently Active  . Days  of Exercise per Week: 3 days  . Minutes of Exercise per Session: 30 min  Stress: No Stress Concern Present  . Feeling of Stress : Only a little  Social Connections: Unknown  . Frequency of Communication with Friends and Family: Three times a week  . Frequency of Social Gatherings with Friends and Family: Twice a week  . Attends Religious Services: Patient refused  .  Active Member of Clubs or Organizations: Patient refused  . Attends Archivist Meetings: Patient refused  . Marital Status: Divorced  Human resources officer Violence: Not At Risk  . Fear of Current or Ex-Partner: No  . Emotionally Abused: No  . Physically Abused: No  . Sexually Abused: No    ROS Per hpi   Objective   Vitals as reported by the patient: There were no vitals filed for this visit.  Diagnoses and all orders for this visit:  Bilious vomiting with nausea -     Metoclopramide HCl 5 MG TBDP; Take 1 tablet (5 mg total) by mouth 3 (three) times daily. -     Cancel: Comprehensive metabolic panel -     Cancel: hCG, serum, qualitative -     Cancel: CBC with Differential -     Cancel: Urinalysis -     Cancel: Ambulatory referral to Gastroenterology -     hCG, serum, qualitative; Future -     CBC with Differential; Future -     CMP14+EGFR; Future -     Urinalysis; Future    PLAN  With regard for the uncertain pregnancy status of the patient, will start with reglan tid prn for symptom control and supportive care. Stabilize symptoms until she can present for labs  Low risk for COVID or other respiratory infection  Will place GI referral, must rule out pregnancy first  Patient encouraged to call clinic with any questions, comments, or concerns.    I discussed the assessment and treatment plan with the patient. The patient was provided an opportunity to ask questions and all were answered. The patient agreed with the plan and demonstrated an understanding of the instructions.   The patient was advised to call back or seek an in-person evaluation if the symptoms worsen or if the condition fails to improve as anticipated.  I provided 23 minutes of non-face-to-face time during this encounter.  Maximiano Coss, NP  [facility]

## 2019-02-04 NOTE — Patient Instructions (Signed)
° ° ° °  If you have lab work done today you will be contacted with your lab results within the next 2 weeks.  If you have not heard from us then please contact us. The fastest way to get your results is to register for My Chart. ° ° °IF you received an x-ray today, you will receive an invoice from [facility]. Please contact [facility] at [phone] with questions or concerns regarding your invoice.  ° °IF you received labwork today, you will receive an invoice from [facility]. Please contact [facility] at [phone] with questions or concerns regarding your invoice.  ° °Our billing staff will not be able to assist you with questions regarding bills from these companies. ° °You will be contacted with the lab results as soon as they are available. The fastest way to get your results is to activate your My Chart account. Instructions are located on the last page of this paperwork. If you have not heard from us regarding the results in 2 weeks, please contact this office. °  ° ° ° °

## 2019-03-11 DIAGNOSIS — Z3041 Encounter for surveillance of contraceptive pills: Secondary | ICD-10-CM | POA: Diagnosis not present

## 2019-11-10 DIAGNOSIS — U071 COVID-19: Secondary | ICD-10-CM

## 2019-11-10 HISTORY — DX: COVID-19: U07.1

## 2019-11-21 ENCOUNTER — Ambulatory Visit: Payer: BC Managed Care – PPO

## 2019-11-21 ENCOUNTER — Other Ambulatory Visit: Payer: Self-pay

## 2019-11-21 DIAGNOSIS — Z20822 Contact with and (suspected) exposure to covid-19: Secondary | ICD-10-CM

## 2019-11-22 LAB — SARS-COV-2, NAA 2 DAY TAT

## 2019-11-22 LAB — NOVEL CORONAVIRUS, NAA: SARS-CoV-2, NAA: DETECTED — AB

## 2019-11-23 ENCOUNTER — Telehealth (HOSPITAL_COMMUNITY): Payer: Self-pay | Admitting: Family

## 2019-11-23 ENCOUNTER — Telehealth: Payer: Self-pay | Admitting: Infectious Diseases

## 2019-11-23 DIAGNOSIS — U071 COVID-19: Secondary | ICD-10-CM

## 2019-11-23 NOTE — Telephone Encounter (Signed)
Called to discuss with Michelle Frey about Covid symptoms and the use of casirivimab/imdevimab, a combination monoclonal antibody infusion for those with mild to moderate Covid symptoms and at a high risk of hospitalization.     Pt is qualified for this infusion at the infusion center due to co-morbid conditions and/or a member of an at-risk group, however declines infusion at this time. Symptoms tier reviewed as well as criteria for ending isolation.  Symptoms reviewed that would warrant ED/Hospital evaluation. Preventative practices reviewed. Patient verbalized understanding. Patient advised to call back if she decides that he does want to get infusion. Callback number to the infusion center given. Patient advised to go to Urgent care or ED with severe symptoms.   Last date she would be eligible for infusion is 11/25/19.     There are no problems to display for this patient.   Michelle Gulbranson,NP

## 2019-11-23 NOTE — Telephone Encounter (Signed)
Called to Discuss with patient about Covid symptoms and the use of the monoclonal antibody infusion for those with mild to moderate Covid symptoms and at a high risk of hospitalization.     Pt appears to qualify for this infusion due to co-morbid conditions and/or a member of an at-risk group in accordance with the FDA Emergency Use Authorization.    Unable to reach pt   Unclear when symptoms started as no care encounter. Tested positive 11/12. Unable to LVM and sent MYChart message.    Michelle Alberts, MSN, NP-C [facility] for Infectious Disease [facility]  [email or web address] Pager: [phone] Office: [phone] [facility] Main Line: [phone]

## 2019-11-25 ENCOUNTER — Encounter: Payer: Self-pay | Admitting: Nurse Practitioner

## 2019-11-25 ENCOUNTER — Ambulatory Visit (HOSPITAL_COMMUNITY)
Admission: RE | Admit: 2019-11-25 | Discharge: 2019-11-25 | Disposition: A | Discharge status: Home / Self Care | Payer: BC Managed Care – PPO | Source: Ambulatory Visit | Attending: Pulmonary Disease | Admitting: Pulmonary Disease

## 2019-11-25 ENCOUNTER — Other Ambulatory Visit: Payer: Self-pay | Admitting: Nurse Practitioner

## 2019-11-25 DIAGNOSIS — U071 COVID-19: Secondary | ICD-10-CM

## 2019-11-25 DIAGNOSIS — E663 Overweight: Secondary | ICD-10-CM

## 2019-11-25 DIAGNOSIS — Z6825 Body mass index (BMI) 25.0-25.9, adult: Secondary | ICD-10-CM | POA: Insufficient documentation

## 2019-11-25 MED ORDER — FAMOTIDINE IN NACL 20-0.9 MG/50ML-% IV SOLN: 20.0000 mg | Freq: Once | INTRAVENOUS | Status: DC | PRN

## 2019-11-25 MED ORDER — SODIUM CHLORIDE 0.9 % IV SOLN: INTRAVENOUS | Status: DC | PRN

## 2019-11-25 MED ORDER — METHYLPREDNISOLONE SODIUM SUCC 125 MG IJ SOLR: 125.0000 mg | Freq: Once | INTRAMUSCULAR | Status: DC | PRN

## 2019-11-25 MED ORDER — EPINEPHRINE 0.3 MG/0.3ML IJ SOAJ: 0.3000 mg | Freq: Once | INTRAMUSCULAR | Status: DC | PRN

## 2019-11-25 MED ORDER — DIPHENHYDRAMINE HCL 50 MG/ML IJ SOLN: 50.0000 mg | Freq: Once | INTRAMUSCULAR | Status: DC | PRN

## 2019-11-25 MED ORDER — ALBUTEROL SULFATE HFA 108 (90 BASE) MCG/ACT IN AERS: 2.0000 | INHALATION_SPRAY | Freq: Once | RESPIRATORY_TRACT | Status: DC | PRN

## 2019-11-25 MED ORDER — SOTROVIMAB 500 MG/8ML IV SOLN
500.0000 mg | Freq: Once | INTRAVENOUS | Status: AC
  Administered 2019-11-25: 500 mg via INTRAVENOUS

## 2019-11-25 NOTE — Progress Notes (Signed)
Diagnosis: COVID-19  Physician: Dr. Patrick Wright  Procedure: Covid Infusion Clinic Med: Sotrovimab infusion - Provided patient with sotrovimab fact sheet for patients, parents, and caregivers prior to infusion.   Complications: No immediate complications noted  Discharge: Discharged home    

## 2019-11-25 NOTE — Progress Notes (Signed)
I connected by phone with Michelle Frey on 11/25/2019 at 9:56 AM to discuss the potential use of a new treatment for mild to moderate COVID-19 viral infection in non-hospitalized patients.  This patient is a 34 y.o. female that meets the FDA criteria for Emergency Use Authorization of COVID monoclonal antibody Sotrovimab.  Has a (+) direct SARS-CoV-2 viral test result  Has mild or moderate COVID-19   Is NOT hospitalized due to COVID-19  Is within 10 days of symptom onset  Has at least one of the high risk factor(s) for progression to severe COVID-19 and/or hospitalization as defined in EUA.  Specific high risk criteria : BMI > 25   I have spoken and communicated the following to the patient or parent/caregiver regarding COVID monoclonal antibody treatment:  1. FDA has authorized the emergency use for the treatment of mild to moderate COVID-19 in adults and pediatric patients with positive results of direct SARS-CoV-2 viral testing who are 68 years of age and older weighing at least 40 kg, and who are at high risk for progressing to severe COVID-19 and/or hospitalization.  2. The significant known and potential risks and benefits of COVID monoclonal antibody, and the extent to which such potential risks and benefits are unknown.  3. Information on available alternative treatments and the risks and benefits of those alternatives, including clinical trials.  4. Patients treated with COVID monoclonal antibody should continue to self-isolate and use infection control measures (e.g., wear mask, isolate, social distance, avoid sharing personal items, clean and disinfect "high touch" surfaces, and frequent handwashing) according to CDC guidelines.   5. The patient or parent/caregiver has the option to accept or refuse COVID monoclonal antibody treatment.  After reviewing this information with the patient, the patient has agreed to receive one of the available covid 19 monoclonal antibodies and  will be provided an appropriate fact sheet prior to infusion.  Nicolasa Ducking, NP 11/25/2019 9:56 AM

## 2019-11-25 NOTE — Discharge Instructions (Signed)

## 2020-02-09 ENCOUNTER — Encounter: Payer: Self-pay | Admitting: Registered Nurse

## 2020-02-09 ENCOUNTER — Other Ambulatory Visit: Payer: Self-pay

## 2020-02-09 ENCOUNTER — Ambulatory Visit (INDEPENDENT_AMBULATORY_CARE_PROVIDER_SITE_OTHER): Payer: BC Managed Care – PPO | Admitting: Registered Nurse

## 2020-02-09 VITALS — BP 121/85 | HR 94 | Temp 97.6°F | Resp 18 | Ht 63.0 in | Wt 178.4 lb

## 2020-02-09 DIAGNOSIS — S060X0A Concussion without loss of consciousness, initial encounter: Secondary | ICD-10-CM

## 2020-02-09 DIAGNOSIS — R102 Pelvic and perineal pain: Secondary | ICD-10-CM | POA: Diagnosis not present

## 2020-02-09 DIAGNOSIS — Z1159 Encounter for screening for other viral diseases: Secondary | ICD-10-CM

## 2020-02-09 DIAGNOSIS — R202 Paresthesia of skin: Secondary | ICD-10-CM

## 2020-02-09 DIAGNOSIS — H18822 Corneal disorder due to contact lens, left eye: Secondary | ICD-10-CM | POA: Diagnosis not present

## 2020-02-09 LAB — POCT URINE PREGNANCY: Preg Test, Ur: NEGATIVE

## 2020-02-09 MED ORDER — NORTRIPTYLINE HCL 10 MG PO CAPS: 10.0000 mg | ORAL_CAPSULE | Freq: Every day | ORAL | 0 refills | Status: DC

## 2020-02-09 NOTE — Progress Notes (Signed)
Established Patient Office Visit  Subjective:  Patient ID: Michelle Frey, female    DOB: 1985-11-03  Age: 35 y.o. MRN: [id number]  CC:  Chief Complaint  Patient presents with  . Referral    Patient would like a referral to obgyn. Patient states she had a fall 3 weeks ago and hit her head and have been having headaches since    HPI Michelle Frey presents for referral and headache  Referral Has had L side pelvic pain while ovulating for a few months No aub, no irregular menses. Not on bcm No bowel or urinary changes Would like referral to obgyn for further workup  Headache Mechanical fall 3 weeks ago Hit back of head Having L side headache No visual change, weakness, loc, or other neuro complications Does note some mild numbness in fingertips but this is unchanged since before fall No hx of concussion or headache syndrome  Past Medical History:  Diagnosis Date  . COVID-19 virus infection 11/2019  . Overweight (BMI 25.0-29.9)     No past surgical history on file.  Family History  Problem Relation Age of Onset  . Hyperlipidemia Mother   . Pancreatic cancer Father   . Alzheimer's disease Maternal Grandmother     Social History   Socioeconomic History  . Marital status: Divorced    Spouse name: Not on file  . Number of children: 1  . Years of education: Not on file  . Highest education level: Not on file  Occupational History  . Occupation: [employer]  Tobacco Use  . Smoking status: Never Smoker  . Smokeless tobacco: Never Used  Vaping Use  . Vaping Use: Never used  Substance and Sexual Activity  . Alcohol use: Yes    Comment: rarely  . Drug use: No  . Sexual activity: Yes    Birth control/protection: Pill  Other Topics Concern  . Not on file  Social History Narrative  . Not on file   Social Determinants of Health   Financial Resource Strain: Not on file  Food Insecurity: Not on file  Transportation Needs: Not on file  Physical Activity: Not on file   Stress: Not on file  Social Connections: Not on file  Intimate Partner Violence: Not on file    Outpatient Medications Prior to Visit  Medication Sig Dispense Refill  . levonorgestrel-ethinyl estradiol (AVIANE,ALESSE,LESSINA) 0.1-20 MG-MCG tablet Take 1 tablet by mouth daily.    . Metoclopramide HCl 5 MG TBDP Take 1 tablet (5 mg total) by mouth 3 (three) times daily. 60 tablet 0  . Multiple Vitamin (MULTIVITAMIN WITH MINERALS) TABS Take 1 tablet by mouth daily.     No facility-administered medications prior to visit.    Allergies  Allergen Reactions  . Amoxicillin Diarrhea and Nausea And Vomiting  . Penicillins Diarrhea and Nausea And Vomiting    ROS Review of Systems Per hpi      Objective:    Physical Exam Vitals and nursing note reviewed.  Constitutional:      General: She is not in acute distress.    Appearance: Normal appearance. She is normal weight. She is not ill-appearing, toxic-appearing or diaphoretic.  Cardiovascular:     Rate and Rhythm: Normal rate and regular rhythm.     Heart sounds: Normal heart sounds. No murmur heard. No friction rub. No gallop.   Pulmonary:     Effort: Pulmonary effort is normal. No respiratory distress.     Breath sounds: Normal breath sounds. No stridor. No  wheezing, rhonchi or rales.  Chest:     Chest wall: No tenderness.  Skin:    General: Skin is warm and dry.  Neurological:     General: No focal deficit present.     Mental Status: She is alert and oriented to person, place, and time. Mental status is at baseline.  Psychiatric:        Mood and Affect: Mood normal.        Behavior: Behavior normal.        Thought Content: Thought content normal.        Judgment: Judgment normal.     BP 121/85   Pulse 94   Temp 97.6 F (36.4 C) (Temporal)   Resp 18   Ht 5\' 3"  (1.6 m)   Wt 178 lb 6.4 oz (80.9 kg)   SpO2 99%   BMI 31.60 kg/m  Wt Readings from Last 3 Encounters:  02/09/20 178 lb 6.4 oz (80.9 kg)  11/05/18 173 lb  (78.5 kg)  04/23/14 170 lb (77.1 kg)     Health Maintenance Due  Topic Date Due  . Hepatitis C Screening  Never done  . HIV Screening  Never done    There are no preventive care reminders to display for this patient.  Lab Results  Component Value Date   TSH 2.490 11/05/2018   Lab Results  Component Value Date   WBC 7.5 11/05/2018   HGB 12.0 11/05/2018   HCT 37.1 11/05/2018   MCV 87 11/05/2018   PLT 429 11/05/2018   Lab Results  Component Value Date   NA 138 11/05/2018   K 3.8 11/05/2018   CO2 21 11/05/2018   GLUCOSE 90 11/05/2018   BUN 5 (L) 11/05/2018   CREATININE 0.68 11/05/2018   BILITOT 0.4 11/05/2018   ALKPHOS 45 11/05/2018   AST 24 11/05/2018   ALT 24 11/05/2018   PROT 7.0 11/05/2018   ALBUMIN 4.0 11/05/2018   CALCIUM 9.2 11/05/2018   Lab Results  Component Value Date   CHOL 164 11/05/2018   Lab Results  Component Value Date   HDL 48 11/05/2018   Lab Results  Component Value Date   LDLCALC 97 11/05/2018   Lab Results  Component Value Date   TRIG 102 11/05/2018   Lab Results  Component Value Date   CHOLHDL 3.4 11/05/2018   Lab Results  Component Value Date   HGBA1C 5.3 11/05/2018      Assessment & Plan:   Problem List Items Addressed This Visit   None   Visit Diagnoses    Pelvic pain    -  Primary   Relevant Orders   Ambulatory referral to Obstetrics / Gynecology   CBC With Differential   Comprehensive metabolic panel   TSH   11/07/2018 Pelvic Complete With Transvaginal   POCT urine pregnancy (Completed)   Paresthesia       Relevant Orders   TSH   Vitamin D, 25-hydroxy   Encounter for screening for other viral diseases       Relevant Orders   HIV antibody (with reflex)   Hepatitis C antibody   Concussion without loss of consciousness, initial encounter       Relevant Medications   nortriptyline (PAMELOR) 10 MG capsule      No orders of the defined types were placed in this encounter.   Follow-up: No follow-ups on file.    PLAN  Labs collected. Will follow up with the patient as warranted.  Concern for ovarian  cyst. Will order [place].  Refer to obgyn  pamelor 10mg  PO qhs for concussion. Consider ct head or referral to neuro if pain does not continue improving  Patient encouraged to call clinic with any questions, comments, or concerns.  , NP

## 2020-02-09 NOTE — Patient Instructions (Signed)
° ° ° °  If you have lab work done today you will be contacted with your lab results within the next 2 weeks.  If you have not heard from us then please contact us. The fastest way to get your results is to register for My Chart. ° ° °IF you received an x-ray today, you will receive an invoice from [facility]. Please contact  Radiology at [phone] with questions or concerns regarding your invoice.  ° °IF you received labwork today, you will receive an invoice from [facility]. Please contact [facility] at [phone] with questions or concerns regarding your invoice.  ° °Our billing staff will not be able to assist you with questions regarding bills from these companies. ° °You will be contacted with the lab results as soon as they are available. The fastest way to get your results is to activate your My Chart account. Instructions are located on the last page of this paperwork. If you have not heard from us regarding the results in 2 weeks, please contact this office. °  ° ° ° °

## 2020-02-10 LAB — COMPREHENSIVE METABOLIC PANEL
ALT: 25 IU/L (ref 0–32)
AST: 18 IU/L (ref 0–40)
Albumin/Globulin Ratio: 1.4 (ref 1.2–2.2)
Albumin: 4.5 g/dL (ref 3.8–4.8)
Alkaline Phosphatase: 48 IU/L (ref 44–121)
BUN/Creatinine Ratio: 7 — ABNORMAL LOW (ref 9–23)
BUN: 5 mg/dL — ABNORMAL LOW (ref 6–20)
Bilirubin Total: 0.3 mg/dL (ref 0.0–1.2)
CO2: 23 mmol/L (ref 20–29)
Calcium: 9.3 mg/dL (ref 8.7–10.2)
Chloride: 103 mmol/L (ref 96–106)
Creatinine, Ser: 0.68 mg/dL (ref 0.57–1.00)
GFR calc Af Amer: 134 mL/min/{1.73_m2} (ref >59)
GFR calc non Af Amer: 116 mL/min/{1.73_m2} (ref >59)
Globulin, Total: 3.2 g/dL (ref 1.5–4.5)
Glucose: 92 mg/dL (ref 65–99)
Potassium: 4.3 mmol/L (ref 3.5–5.2)
Sodium: 138 mmol/L (ref 134–144)
Total Protein: 7.7 g/dL (ref 6.0–8.5)

## 2020-02-10 LAB — CBC WITH DIFFERENTIAL
Basophils Absolute: 0 10*3/uL (ref 0.0–0.2)
Basos: 1 %
EOS (ABSOLUTE): 0.1 10*3/uL (ref 0.0–0.4)
Eos: 1 %
Hematocrit: 37.4 % (ref 34.0–46.6)
Hemoglobin: 12.9 g/dL (ref 11.1–15.9)
Immature Grans (Abs): 0 10*3/uL (ref 0.0–0.1)
Immature Granulocytes: 0 %
Lymphocytes Absolute: 1.8 10*3/uL (ref 0.7–3.1)
Lymphs: 34 %
MCH: 29.4 pg (ref 26.6–33.0)
MCHC: 34.5 g/dL (ref 31.5–35.7)
MCV: 85 fL (ref 79–97)
Monocytes Absolute: 0.5 10*3/uL (ref 0.1–0.9)
Monocytes: 9 %
Neutrophils Absolute: 2.9 10*3/uL (ref 1.4–7.0)
Neutrophils: 55 %
RBC: 4.39 x10E6/uL (ref 3.77–5.28)
RDW: 12.4 % (ref 11.7–15.4)
WBC: 5.2 10*3/uL (ref 3.4–10.8)

## 2020-02-10 LAB — VITAMIN D 25 HYDROXY (VIT D DEFICIENCY, FRACTURES): Vit D, 25-Hydroxy: 16.8 ng/mL — ABNORMAL LOW (ref 30.0–100.0)

## 2020-02-10 LAB — TSH: TSH: 1.16 u[IU]/mL (ref 0.450–4.500)

## 2020-02-10 LAB — HEPATITIS C ANTIBODY: Hep C Virus Ab: <0.1 s/co ratio (ref 0.0–0.9)

## 2020-02-10 LAB — HIV ANTIBODY (ROUTINE TESTING W REFLEX): HIV Screen 4th Generation wRfx: NONREACTIVE

## 2020-02-16 DIAGNOSIS — Z20822 Contact with and (suspected) exposure to covid-19: Secondary | ICD-10-CM | POA: Diagnosis not present

## 2020-02-19 ENCOUNTER — Ambulatory Visit
Admission: RE | Admit: 2020-02-19 | Discharge: 2020-02-19 | Disposition: A | Discharge status: Home / Self Care | Payer: BC Managed Care – PPO | Source: Ambulatory Visit | Attending: Registered Nurse | Admitting: Registered Nurse

## 2020-02-19 DIAGNOSIS — N854 Malposition of uterus: Secondary | ICD-10-CM | POA: Diagnosis not present

## 2020-02-19 DIAGNOSIS — R102 Pelvic and perineal pain: Secondary | ICD-10-CM

## 2020-02-19 DIAGNOSIS — N83202 Unspecified ovarian cyst, left side: Secondary | ICD-10-CM | POA: Diagnosis not present

## 2020-02-25 ENCOUNTER — Telehealth: Payer: Self-pay | Admitting: Registered Nurse

## 2020-02-25 ENCOUNTER — Other Ambulatory Visit: Payer: Self-pay | Admitting: Registered Nurse

## 2020-02-25 DIAGNOSIS — N83209 Unspecified ovarian cyst, unspecified side: Secondary | ICD-10-CM

## 2020-02-25 DIAGNOSIS — E559 Vitamin D deficiency, unspecified: Secondary | ICD-10-CM

## 2020-02-25 MED ORDER — VITAMIN D (ERGOCALCIFEROL) 1.25 MG (50000 UNIT) PO CAPS: 50000.0000 [IU] | ORAL_CAPSULE | ORAL | 0 refills | Status: DC

## 2020-02-25 NOTE — Telephone Encounter (Signed)
patient just received a call from  [place] location  [facility]  For an appt just scheduled today / pateint is concerned because she just had an appt on the 10th.   Patient is concerned / wants to make sure it is not a duplicate / or did miss something ?   Please call patient

## 2020-02-26 NOTE — Telephone Encounter (Signed)
Patient was informed of her [place] report and she was supposed to have a f/u US in 8-10 weeks and  that was the reason [facility] was calling her to get that scheduled. Patient states she is still in a lot of pain is there any meds we can send in for her pain?

## 2020-03-01 ENCOUNTER — Other Ambulatory Visit (HOSPITAL_COMMUNITY)
Admission: RE | Admit: 2020-03-01 | Discharge: 2020-03-01 | Disposition: A | Discharge status: Home / Self Care | Payer: BC Managed Care – PPO | Source: Ambulatory Visit | Attending: Registered Nurse | Admitting: Registered Nurse

## 2020-03-01 ENCOUNTER — Other Ambulatory Visit: Payer: Self-pay

## 2020-03-01 ENCOUNTER — Encounter: Payer: Self-pay | Admitting: Registered Nurse

## 2020-03-01 ENCOUNTER — Ambulatory Visit (INDEPENDENT_AMBULATORY_CARE_PROVIDER_SITE_OTHER): Payer: BC Managed Care – PPO | Admitting: Registered Nurse

## 2020-03-01 VITALS — BP 112/80 | HR 92 | Ht 62.0 in | Wt 175.0 lb

## 2020-03-01 DIAGNOSIS — N83209 Unspecified ovarian cyst, unspecified side: Secondary | ICD-10-CM

## 2020-03-01 DIAGNOSIS — M5442 Lumbago with sciatica, left side: Secondary | ICD-10-CM

## 2020-03-01 DIAGNOSIS — N898 Other specified noninflammatory disorders of vagina: Secondary | ICD-10-CM | POA: Diagnosis not present

## 2020-03-01 DIAGNOSIS — M5441 Lumbago with sciatica, right side: Secondary | ICD-10-CM | POA: Insufficient documentation

## 2020-03-01 DIAGNOSIS — R102 Pelvic and perineal pain: Secondary | ICD-10-CM | POA: Diagnosis not present

## 2020-03-01 DIAGNOSIS — E559 Vitamin D deficiency, unspecified: Secondary | ICD-10-CM

## 2020-03-01 DIAGNOSIS — R1030 Lower abdominal pain, unspecified: Secondary | ICD-10-CM

## 2020-03-01 LAB — POCT CBC
Granulocyte percent: 55.6 %G (ref 37–80)
HCT, POC: 41.7 % — AB (ref 29–41)
Hemoglobin: 13.9 g/dL (ref 11–14.6)
Lymph, poc: 1.6 (ref 0.6–3.4)
MCH, POC: 29.5 pg (ref 27–31.2)
MCHC: 33.3 g/dL (ref 31.8–35.4)
MCV: 88.7 fL (ref 76–111)
MID (cbc): 0.3 (ref 0–0.9)
MPV: 6.9 fL (ref 0–99.8)
POC Granulocyte: 2.5 (ref 2–6.9)
POC LYMPH PERCENT: 36.6 %L (ref 10–50)
POC MID %: 7.8 %M (ref 0–12)
Platelet Count, POC: 381 10*3/uL (ref 142–424)
RBC: 4.7 M/uL (ref 4.04–5.48)
RDW, POC: 13.1 %
WBC: 4.4 10*3/uL — AB (ref 4.6–10.2)

## 2020-03-01 LAB — POCT WET + KOH PREP
Trich by wet prep: ABSENT
Yeast by KOH: ABSENT
Yeast by wet prep: ABSENT

## 2020-03-01 LAB — POCT URINE PREGNANCY: Preg Test, Ur: NEGATIVE

## 2020-03-01 MED ORDER — VITAMIN D (ERGOCALCIFEROL) 1.25 MG (50000 UNIT) PO CAPS: 50000.0000 [IU] | ORAL_CAPSULE | ORAL | 0 refills | Status: AC

## 2020-03-01 NOTE — Progress Notes (Signed)
Established Patient Office Visit  Subjective:  Patient ID: Michelle Frey, female    DOB: Aug 18, 1985  Age: 35 y.o. MRN: [id number]  CC:  Chief Complaint  Patient presents with  . left side painful     Now in hips and legs/ Has OB appt   . Depression    PHQ9= 8    HPI Michelle Frey presents for cyst follow up   Noted multiple ovarian cysts on [place]  Now having worsening pain- peaked last night No vaginal bleeding but some discharge. Having pain in back radiating into legs. Waking her at night No unexplained weight loss, fevers, chills, constipation, diarrhea, or other sxs at this time In a monogamous relationship  Past Medical History:  Diagnosis Date  . COVID-19 virus infection 11/2019  . Overweight (BMI 25.0-29.9)     No past surgical history on file.  Family History  Problem Relation Age of Onset  . Hyperlipidemia Mother   . Pancreatic cancer Father   . Alzheimer's disease Maternal Grandmother     Social History   Socioeconomic History  . Marital status: Divorced    Spouse name: Not on file  . Number of children: 1  . Years of education: Not on file  . Highest education level: Not on file  Occupational History  . Occupation: [employer]  Tobacco Use  . Smoking status: Never Smoker  . Smokeless tobacco: Never Used  Vaping Use  . Vaping Use: Never used  Substance and Sexual Activity  . Alcohol use: Yes    Comment: rarely  . Drug use: No  . Sexual activity: Yes    Birth control/protection: Pill  Other Topics Concern  . Not on file  Social History Narrative  . Not on file   Social Determinants of Health   Financial Resource Strain: Not on file  Food Insecurity: Not on file  Transportation Needs: Not on file  Physical Activity: Not on file  Stress: Not on file  Social Connections: Not on file  Intimate Partner Violence: Not on file    Outpatient Medications Prior to Visit  Medication Sig Dispense Refill  . Metoclopramide HCl 5 MG TBDP Take 1 tablet  (5 mg total) by mouth 3 (three) times daily. 60 tablet 0  . Multiple Vitamin (MULTIVITAMIN WITH MINERALS) TABS Take 1 tablet by mouth daily.    . nortriptyline (PAMELOR) 10 MG capsule Take 1 capsule (10 mg total) by mouth at bedtime. 90 capsule 0  . levonorgestrel-ethinyl estradiol (AVIANE,ALESSE,LESSINA) 0.1-20 MG-MCG tablet Take 1 tablet by mouth daily.    . Vitamin D, Ergocalciferol, (DRISDOL) 1.25 MG (50000 UNIT) CAPS capsule Take 1 capsule (50,000 Units total) by mouth every 7 (seven) days. (Patient not taking: Reported on 03/01/2020) 12 capsule 0   No facility-administered medications prior to visit.    Allergies  Allergen Reactions  . Amoxicillin Diarrhea and Nausea And Vomiting  . Penicillins Diarrhea and Nausea And Vomiting    ROS Review of Systems  Constitutional: Negative.   HENT: Negative.   Eyes: Negative.   Respiratory: Negative.   Cardiovascular: Negative.   Gastrointestinal: Negative.   Genitourinary: Negative.   Musculoskeletal: Negative.   Skin: Negative.   Neurological: Negative.   Psychiatric/Behavioral: Negative.   All other systems reviewed and are negative.     Objective:    Physical Exam Vitals and nursing note reviewed.  Constitutional:      General: She is not in acute distress.    Appearance: Normal appearance. She is  normal weight. She is not ill-appearing, toxic-appearing or diaphoretic.  Cardiovascular:     Rate and Rhythm: Normal rate and regular rhythm.     Heart sounds: Normal heart sounds. No murmur heard. No friction rub. No gallop.   Pulmonary:     Effort: Pulmonary effort is normal. No respiratory distress.     Breath sounds: Normal breath sounds. No stridor. No wheezing, rhonchi or rales.  Chest:     Chest wall: No tenderness.  Abdominal:     General: Abdomen is flat. Bowel sounds are normal.     Tenderness: There is generalized abdominal tenderness and tenderness in the suprapubic area. There is right CVA tenderness and left CVA  tenderness.  Skin:    General: Skin is warm and dry.  Neurological:     General: No focal deficit present.     Mental Status: She is alert and oriented to person, place, and time. Mental status is at baseline.  Psychiatric:        Mood and Affect: Mood normal.        Behavior: Behavior normal.        Thought Content: Thought content normal.        Judgment: Judgment normal.     BP 112/80   Pulse 92   Ht 5\' 2"  (1.575 m)   Wt 175 lb (79.4 kg)   LMP 02/23/2020   SpO2 100%   BMI 32.01 kg/m  Wt Readings from Last 3 Encounters:  03/01/20 175 lb (79.4 kg)  02/09/20 178 lb 6.4 oz (80.9 kg)  11/05/18 173 lb (78.5 kg)     Health Maintenance Due  Topic Date Due  . PAP SMEAR-Modifier  Never done  . COVID-19 Vaccine (2 - Pfizer 3-dose series) 02/28/2020    There are no preventive care reminders to display for this patient.  Lab Results  Component Value Date   TSH 1.160 02/09/2020   Lab Results  Component Value Date   WBC 4.4 (A) 03/01/2020   HGB 13.9 03/01/2020   HCT 41.7 (A) 03/01/2020   MCV 88.7 03/01/2020   PLT 429 11/05/2018   Lab Results  Component Value Date   NA 138 02/09/2020   K 4.3 02/09/2020   CO2 23 02/09/2020   GLUCOSE 92 02/09/2020   BUN 5 (L) 02/09/2020   CREATININE 0.68 02/09/2020   BILITOT 0.3 02/09/2020   ALKPHOS 48 02/09/2020   AST 18 02/09/2020   ALT 25 02/09/2020   PROT 7.7 02/09/2020   ALBUMIN 4.5 02/09/2020   CALCIUM 9.3 02/09/2020   Lab Results  Component Value Date   CHOL 164 11/05/2018   Lab Results  Component Value Date   HDL 48 11/05/2018   Lab Results  Component Value Date   LDLCALC 97 11/05/2018   Lab Results  Component Value Date   TRIG 102 11/05/2018   Lab Results  Component Value Date   CHOLHDL 3.4 11/05/2018   Lab Results  Component Value Date   HGBA1C 5.3 11/05/2018      Assessment & Plan:   Problem List Items Addressed This Visit   None   Visit Diagnoses    Pelvic pain    -  Primary   Relevant  Orders   POCT CBC (Completed)   Follicle Stimulating Hormone   Testosterone   Urine cytology ancillary only   Vitamin D deficiency       Relevant Medications   Vitamin D, Ergocalciferol, (DRISDOL) 1.25 MG (50000 UNIT) CAPS capsule  Other Relevant Orders   POCT CBC (Completed)   Cyst of ovary, unspecified laterality       Relevant Orders   POCT CBC (Completed)   Acute midline low back pain with bilateral sciatica       Relevant Orders   POCT CBC (Completed)   POCT urine pregnancy (Completed)   hCG, serum, qualitative   Urine cytology ancillary only   Vaginal discharge       Relevant Orders   POCT CBC (Completed)   POCT wet + KOH prep (Completed)   Urine cytology ancillary only   Lower abdominal pain       Relevant Orders   CT Abdomen Pelvis Wo Contrast      Meds ordered this encounter  Medications  . Vitamin D, Ergocalciferol, (DRISDOL) 1.25 MG (50000 UNIT) CAPS capsule    Sig: Take 1 capsule (50,000 Units total) by mouth every 7 (seven) days.    Dispense:  12 capsule    Refill:  0    Order Specific Question:   Supervising Provider    Answer:   Neva Seat, JEFFREY R [[id number]]    Follow-up: No follow-ups on file.   PLAN  poct wet prep shows BV. Will treat with metro, will give diflucan with hx of subsequent candidal infection  poct preg test negative  poct CBC shows no acute concerns, wbc not indicative of infection  Send out labs that may help indicate pcos  Refill vitamin d - pt never picked this up  Given red flag of nighttime back pain waking her, will order stat CT  Pt still needs to follow up with OBGYN, has been scheduled for mid march.  Patient encouraged to call clinic with any questions, comments, or concerns.  Janeece Agee, NP

## 2020-03-01 NOTE — Patient Instructions (Signed)
° ° ° °  If you have lab work done today you will be contacted with your lab results within the next 2 weeks.  If you have not heard from us then please contact us. The fastest way to get your results is to register for My Chart. ° ° °IF you received an x-ray today, you will receive an invoice from [facility]. Please contact [facility] at [phone] with questions or concerns regarding your invoice.  ° °IF you received labwork today, you will receive an invoice from [facility]. Please contact [facility] at [phone] with questions or concerns regarding your invoice.  ° °Our billing staff will not be able to assist you with questions regarding bills from these companies. ° °You will be contacted with the lab results as soon as they are available. The fastest way to get your results is to activate your My Chart account. Instructions are located on the last page of this paperwork. If you have not heard from us regarding the results in 2 weeks, please contact this office. °  ° ° ° °

## 2020-03-02 ENCOUNTER — Telehealth: Payer: Self-pay | Admitting: Registered Nurse

## 2020-03-02 ENCOUNTER — Other Ambulatory Visit: Payer: BC Managed Care – PPO

## 2020-03-02 ENCOUNTER — Other Ambulatory Visit: Payer: Self-pay | Admitting: Registered Nurse

## 2020-03-02 ENCOUNTER — Other Ambulatory Visit: Payer: Self-pay

## 2020-03-02 DIAGNOSIS — B9689 Other specified bacterial agents as the cause of diseases classified elsewhere: Secondary | ICD-10-CM

## 2020-03-02 DIAGNOSIS — R1114 Bilious vomiting: Secondary | ICD-10-CM

## 2020-03-02 LAB — FOLLICLE STIMULATING HORMONE: FSH: 5.5 m[IU]/mL

## 2020-03-02 LAB — TESTOSTERONE: Testosterone: 49 ng/dL (ref 8–60)

## 2020-03-02 LAB — HCG, SERUM, QUALITATIVE: hCG,Beta Subunit,Qual,Serum: NEGATIVE m[IU]/mL (ref <6)

## 2020-03-02 MED ORDER — METRONIDAZOLE 500 MG PO TABS: 500.0000 mg | ORAL_TABLET | Freq: Two times a day (BID) | ORAL | 0 refills | Status: DC

## 2020-03-02 MED ORDER — FLUCONAZOLE 150 MG PO TABS: 150.0000 mg | ORAL_TABLET | Freq: Once | ORAL | 0 refills | Status: AC

## 2020-03-02 MED ORDER — FLUCONAZOLE 150 MG PO TABS: 150.0000 mg | ORAL_TABLET | Freq: Once | ORAL | 0 refills | Status: DC

## 2020-03-02 NOTE — Telephone Encounter (Signed)
[facility] sent [facility]

## 2020-03-02 NOTE — Telephone Encounter (Signed)
Received call from

## 2020-03-02 NOTE — Telephone Encounter (Signed)
Michelle Frey from [facility] called in needing help with a prior auth for abdominal and pelvis without contrast. Raynelle Fanning helped before she left, but she was given the wrong information. The PA needs to state---abdominal and pelvis without contrast, a Combined Study  CPT code 15830. The pt has been sent home, they stop doing walk-ins at 3.

## 2020-03-02 NOTE — Telephone Encounter (Signed)
I called to f/u on the approval for the CT scan and they stated they have up to 03/04/20 to give approval for the CT study. Called [employer] and left this message for [place]. Will try to call [facility] again tomorrow to give them the above info from [facility]

## 2020-03-02 NOTE — Telephone Encounter (Signed)
Pt needs her two scripts we sent in on today 03/02/20 sent to [facility] on [address].

## 2020-03-03 LAB — URINE CYTOLOGY ANCILLARY ONLY
Chlamydia: NEGATIVE
Comment: NEGATIVE
Comment: NEGATIVE
Comment: NORMAL
Neisseria Gonorrhea: NEGATIVE
Trichomonas: NEGATIVE

## 2020-03-05 ENCOUNTER — Other Ambulatory Visit: Payer: Self-pay

## 2020-03-05 ENCOUNTER — Ambulatory Visit
Admission: RE | Admit: 2020-03-05 | Discharge: 2020-03-05 | Disposition: A | Discharge status: Home / Self Care | Payer: BC Managed Care – PPO | Source: Ambulatory Visit | Attending: Registered Nurse | Admitting: Registered Nurse

## 2020-03-05 DIAGNOSIS — R109 Unspecified abdominal pain: Secondary | ICD-10-CM | POA: Diagnosis not present

## 2020-03-05 DIAGNOSIS — N200 Calculus of kidney: Secondary | ICD-10-CM | POA: Diagnosis not present

## 2020-03-05 DIAGNOSIS — R1030 Lower abdominal pain, unspecified: Secondary | ICD-10-CM

## 2020-03-05 DIAGNOSIS — K820 Obstruction of gallbladder: Secondary | ICD-10-CM | POA: Diagnosis not present

## 2020-03-08 NOTE — Telephone Encounter (Signed)
This has been completed.

## 2020-03-10 ENCOUNTER — Encounter: Payer: BC Managed Care – PPO | Admitting: Obstetrics and Gynecology

## 2020-03-12 ENCOUNTER — Encounter: Payer: BC Managed Care – PPO | Admitting: Obstetrics & Gynecology

## 2020-03-16 ENCOUNTER — Other Ambulatory Visit: Payer: BC Managed Care – PPO

## 2020-03-24 ENCOUNTER — Other Ambulatory Visit (HOSPITAL_COMMUNITY)
Admission: RE | Admit: 2020-03-24 | Discharge: 2020-03-24 | Disposition: A | Discharge status: Home / Self Care | Payer: BC Managed Care – PPO | Source: Ambulatory Visit | Attending: Obstetrics and Gynecology | Admitting: Obstetrics and Gynecology

## 2020-03-24 ENCOUNTER — Other Ambulatory Visit: Payer: Self-pay

## 2020-03-24 ENCOUNTER — Encounter: Payer: Self-pay | Admitting: Obstetrics and Gynecology

## 2020-03-24 ENCOUNTER — Ambulatory Visit (INDEPENDENT_AMBULATORY_CARE_PROVIDER_SITE_OTHER): Payer: BC Managed Care – PPO | Admitting: Obstetrics and Gynecology

## 2020-03-24 VITALS — BP 120/85 | HR 86 | Ht 62.0 in | Wt 177.0 lb

## 2020-03-24 DIAGNOSIS — Z01419 Encounter for gynecological examination (general) (routine) without abnormal findings: Secondary | ICD-10-CM | POA: Insufficient documentation

## 2020-03-24 DIAGNOSIS — N946 Dysmenorrhea, unspecified: Secondary | ICD-10-CM | POA: Insufficient documentation

## 2020-03-24 DIAGNOSIS — N83202 Unspecified ovarian cyst, left side: Secondary | ICD-10-CM

## 2020-03-24 DIAGNOSIS — N83209 Unspecified ovarian cyst, unspecified side: Secondary | ICD-10-CM | POA: Insufficient documentation

## 2020-03-24 MED ORDER — IBUPROFEN 800 MG PO TABS: 800.0000 mg | ORAL_TABLET | Freq: Three times a day (TID) | ORAL | 3 refills | Status: AC | PRN

## 2020-03-24 NOTE — Progress Notes (Signed)
  CC: annual exam, ovarian cysts Subjective:    Patient ID: Michelle Frey, female    DOB: 1985/01/14, 35 y.o.   MRN: [id number]  HPI 35 yo G1P1, SVD x 1, seen at [facility] due to recent history of left ovarian cysts.  Pt notes 2-3 month history of pain which she describes as crampy.  The pain is intermittent.  Nothing really makes it worse and ibuprofen tends to make it better.The pt denies dyspareunia and notes mild nausea occasionally.  She does have occasional bouts of constipation where she can go up to 2 weeks between bowel movements.  She denies dysuria.     Review of Systems  Constitutional: Negative.   HENT: Negative.   Respiratory: Negative.   Cardiovascular: Negative.   Gastrointestinal: Positive for constipation and nausea. Negative for vomiting.  Genitourinary: Positive for pelvic pain. Negative for dyspareunia, dysuria and menstrual problem.  Musculoskeletal: Negative.   Neurological: Negative.   Psychiatric/Behavioral: Negative.        Objective:   Physical Exam Vitals reviewed.  Constitutional:      General: She is not in acute distress.    Appearance: Normal appearance. She is normal weight.  Cardiovascular:     Rate and Rhythm: Normal rate and regular rhythm.     Heart sounds: Normal heart sounds.  Pulmonary:     Effort: Pulmonary effort is normal.     Breath sounds: Normal breath sounds.  Abdominal:     General: Abdomen is flat. There is no distension.     Palpations: Abdomen is soft. There is no mass.     Tenderness: There is no abdominal tenderness. There is no guarding.  Genitourinary:    Comments: SVE:  No CMT, uterus nontender and normal in size.  No adnexal mass or pain detected Musculoskeletal:        General: Normal range of motion.  Skin:    General: Skin is warm and dry.  Neurological:     Mental Status: She is alert.    Vitals:   03/24/20 0941  BP: 120/85  Pulse: 86         Assessment & Plan:   1. Dysmenorrhea NSAIDs for  pain control, discussed OCP can be used to decrease menstrual pain - ibuprofen (ADVIL) 800 MG tablet; Take 1 tablet (800 mg total) by mouth 3 (three) times daily with meals as needed for headache, moderate pain or cramping.  Dispense: 30 tablet; Refill: 3  2. Cyst of left ovary Pt has repeat ultrasound scheduled in approx 1 week, will follow up on results.  Would pursue conservative approach since they do not appear to be causing pain.  3. Well woman exam with routine gynecological exam Will discus ocp and possible GI referral at next visit - Cytology - PAP( [place]) F/u in 3 weeks   Warden Fillers, MD Faculty Attending, [facility]

## 2020-03-24 NOTE — Patient Instructions (Signed)
Oral Contraception Information Oral contraceptive pills (OCPs) are medicines taken by mouth to prevent pregnancy. They work by:  Preventing the ovaries from releasing eggs.  Thickening mucus in the lower part of the uterus (cervix). This prevents sperm from entering the uterus.  Thinning the lining of the uterus (endometrium). This prevents a fertilized egg from attaching to the endometrium. OCPs are highly effective when taken exactly as prescribed. However, OCPs do not prevent STIs (sexually transmitted infections). Using condoms while on an OCP can help prevent STIs. What happens before starting OCPs? Before you start taking OCPs:  You may have a physical exam, blood test, and Pap test.  Your health care provider will make sure you are a good candidate for oral contraception. OCPs are not a good option for certain women, such as: ? Women who smoke and are older than age 35. ? Women who have or have had certain conditions, such as:  A history of high blood pressure.  Deep vein thrombosis.  Pulmonary embolism.  Stroke.  Cardiovascular disease.  Peripheral vascular disease. Ask your health care provider about the possible side effects of the OCP you may be prescribed. Be aware that it can take 2-3 months for your body to adjust to changes in hormone levels. Types of oral contraception Birth control pills contain the hormones estrogen and progestin (synthetic progesterone) or progestin only. The combination pill This type of pill contains estrogen and progestin hormones.  Conventional contraception pills come in packs of 21 or 28 pills. ? Some packs with 28-day pills contain estrogen and progestin for the first 21-24 days. Hormone-free tablets, called placebos, are taken for the final 4-7 days. You should have menstrual bleeding during the time you take the placebos. ? In packs with 21 tablets, you take no pills for 7 days. Menstrual bleeding occurs during these days. (Some people  prefer taking a pill for 28 days to help establish a routine).  Extended-interval contraception pills come in packs of 91 pills. The first 84 tablets have both estrogen and progestin. The last 7 pills are placebos. Menstrual bleeding occurs during the placebo days. With this schedule, menstrual bleeding happens once every 3 months.  Continuous contraception pills come in packs of 28 pills. All pills in the pack contain estrogen and progestin. With this schedule, regular menstrual bleeding does not happen, but there may be spotting or irregular bleeding. Progestin-only pills This type of pill is often called the mini-pill and contains the progestin hormone only. It comes in packs of 28 pills. In some packs, the last 4 pills are placebos. The pill must be taken at the same time every day. This is very important to prevent pregnancy. Menstrual bleeding may not be regular or predictable.   What are the advantages? Oral contraception provides reliable and continuous contraception if taken as directed. It may treat or decrease symptoms of:  Menstrual period cramps.  Irregular menstrual cycle or bleeding.  Heavy menstrual flow.  Abnormal uterine bleeding.  Acne, depending on the type of pill.  Polycystic ovarian syndrome (POS).  Endometriosis.  Iron deficiency anemia.  Premenstrual symptoms, including severe irritability, depression, or anxiety. It also may:  Reduce the risk of endometrial and ovarian cancer.  Be used as emergency contraception.  Prevent ectopic pregnancies and infections of the fallopian tubes. What can make OCPs less effective? OCPs may be less effective if:  You forget to take the pill every day. For progestin-only pills, it is especially important to take the pill at the   same time each day. Even taking it 3 hours late can increase the risk of pregnancy.  You have a stomach or intestinal disease that reduces your body's ability to absorb the pill.  You take OCPs  with other medicines that make OCPs less effective, such as antibiotics, certain HIV medicines, and some seizure medicines.  You take expired OCPs.  You forget to restart the pill after 7 days of not taking it. This refers to the packs of 21 pills. What are the side effects and risks? OCPs can sometimes cause side effects, such as:  Headache.  Depression.  Trouble sleeping.  Nausea and vomiting.  Breast tenderness.  Irregular bleeding or spotting during the first several months.  Bloating or fluid retention.  Increase in blood pressure. Combination pills may slightly increase the risk of:  Blood clots.  Heart attack.  Stroke. Follow these instructions at home: Follow instructions from your health care provider about how to start taking your first cycle of OCPs. Depending on when you start the pill, you may need to use a backup form of birth control, such as condoms, during the first week. Make sure you know what steps to take if you forget to take the pill. Summary  Oral contraceptive pills (OCPs) are medicines taken by mouth to prevent pregnancy. They are highly effective when taken exactly as prescribed.  OCPs contain a combination of the hormones estrogen and progestin (synthetic progesterone) or progestin only.  Before you start taking the pill, you may have a physical exam, blood test, and Pap test. Your health care provider will make sure you are a good candidate for oral contraception.  The combination pill may come in a 21-day pack, a 28-day pack, or a 91-day pack. Progestin-only pills come in packs of 28 pills.  OCPs can sometimes cause side effects, such as headache, nausea, breast tenderness, or irregular bleeding. This information is not intended to replace advice given to you by your health care provider. Make sure you discuss any questions you have with your health care provider. Document Revised: 09/26/2019 Document Reviewed: 09/04/2019 Elsevier Patient  Education  2021 Else[employer] Inc. Ovarian Cyst  An ovarian cyst is a fluid-filled sac on an ovary. Most of these cysts go away on their own and are not cancer. Some cysts need treatment. What are the causes?  Ovarian hyperstimulation syndrome. Some medicines may lead to this problem.  Polycystic ovarian syndrome (PCOS). Problems with body chemicals (hormones) can lead to this condition.  The normal menstrual cycle. What increases the risk?  Being overweight or very overweight.  Taking medicines to increase your chance of getting pregnant.  Using some types of birth control.  Smoking. What are the signs or symptoms? Many ovarian cysts do not cause symptoms. If you get symptoms, you may have:  Pain or pressure in the area between the hip bones.  Pain in the lower belly.  Pain during sex.  Swelling in the lower belly.  Periods that are not regular.  Pain with periods. How is this treated? Many ovarian cysts go away on their own without treatment. If you need treatment, it may include:  Medicines for pain.  Fluid taken out of the cyst.  The cyst being taken out.  Birth control pills or other medicines.  Surgery to remove the ovary. Follow these instructions at home:  Take over-the-counter and prescription medicines only as told by your doctor.  Ask your doctor if you should avoid driving or using machines while you are  taking your medicine.  Get exams and Pap tests as told by your doctor.  Return to your normal activities when your doctor says that it is safe.  Do not smoke or use any products that contain nicotine or tobacco. If you need help quitting, ask your doctor.  Keep all follow-up visits. Contact a doctor if:  Your periods: ? Are late. ? Are not regular. ? Stop. ? Are painful.  You have pain in the area between your hip bones, and the pain does not go away.  You feel pressure on your bladder.  You have trouble peeing.  You feel full, or your  belly hurts, swells, or bloats.  You gain or lose weight without trying, or you are less hungry than normal.  You feel pain and pressure in your back.  You feel pain and pressure in the area between your hip bones.  You think you may be pregnant. Get help right away if:  You have pain in your belly that is very bad or gets worse.  You have pain in the area between your hip bones, and the pain is very bad or gets worse.  You cannot eat or drink without vomiting.  You get a fever or chills all of a sudden.  Your period is a lot heavier than usual. Summary  An ovarian cyst is a fluid-filled sac on an ovary.  Some cysts may cause problems and need treatment.  Most of these cysts go away on their own. This information is not intended to replace advice given to you by your health care provider. Make sure you discuss any questions you have with your health care provider. Document Revised: 06/05/2019 Document Reviewed: 06/05/2019 Elsevier Patient Education  2021 [employer].

## 2020-03-25 LAB — CYTOLOGY - PAP
Comment: NEGATIVE
Diagnosis: NEGATIVE
High risk HPV: NEGATIVE

## 2020-03-29 NOTE — BH Specialist Note (Signed)
Integrated Behavioral Health via Telemedicine Visit  03/29/2020 Michelle Frey [id number]  Number of Integrated Behavioral Health visits: 1 Session Start time: 8:19  Session End time: 9:08 Total time: 64  Referring Provider: Mariel Aloe, MD Patient/Family location: Home [facility] Provider location: Center for [facility] at [facility]  All persons participating in visit: Patient Michelle Frey and [facility] Zury Fazzino   Types of Service: Individual psychotherapy and Video visit  I connected with [name] and/or Loel Ro Deliz's n/a via  Telephone or Video Enabled Telemedicine Application  (Video is Caregility application) and verified that I am speaking with the correct person using two identifiers. Discussed confidentiality: Yes   I discussed the limitations of telemedicine and the availability of in person appointments.  Discussed there is a possibility of technology failure and discussed alternative modes of communication if that failure occurs.  I discussed that engaging in this telemedicine visit, they consent to the provision of behavioral healthcare and the services will be billed under their insurance.  Patient and/or legal guardian expressed understanding and consented to Telemedicine visit: Yes   Presenting Concerns: Patient and/or family reports the following symptoms/concerns: Pt states her primary concern today is an increase in anxious and depressive symptoms; feeling anxious "all of a sudden" with a "nervous type of energy"; copes by doing deep breathing in the moment. Pt is working from home (10hrs/day) while managing 10yo son's busy schedule and household with minimal help from husband.  Pt is sleeping about 5 hours/night, drinks one energy drink daily, and is open to learning coping strategies today.  Duration of problem: Increase over time in recent months; Severity of problem: moderate  Patient and/or Family's Strengths/Protective  Factors: Social and Emotional competence, Concrete supports in place (healthy food, safe environments, etc.), Sense of purpose and Physical Health (exercise, healthy diet, medication compliance, etc.)  Goals Addressed: Patient will: 1.  Reduce symptoms of: anxiety, depression and stress  2.  Increase knowledge and/or ability of: healthy habits and self-management skills  3.  Demonstrate ability to: Increase healthy adjustment to current life circumstances  Progress towards Goals: Ongoing  Interventions: Interventions utilized:  Solution-Focused Strategies, Mindfulness or Management consultant, Sleep Hygiene and Psychoeducation and/or Health Education Standardized Assessments completed: Not Needed  Patient and/or Family Response: Pt agrees to treatment plan   Assessment: Patient currently experiencing Adjustment disorder with mixed anxious and depressed mood.   Patient may benefit from psychoeducation and brief therapeutic interventions regarding coping with symptoms of depression and anxiety .  Plan: 1. Follow up with behavioral health clinician on : Two weeks 2. Behavioral recommendations:  -Continue taking Pamelor and Vitamin D as prescribed -CALM relaxation breathing twice daily (morning; at bedtime with sleep sounds); continue using deep breathing when anxious feeling arise throughout the day -Continue plan to delegate household tasks to family members in the home -Consider decreasing energy drink to 3/4 drink/day for next two weeks  -Consider reading through additional information on After Visit Summary (apps and anxiety with panic attack tips) 3. Referral(s): Integrated [employer] (In Clinic)  I discussed the assessment and treatment plan with the patient and/or parent/guardian. They were provided an opportunity to ask questions and all were answered. They agreed with the plan and demonstrated an understanding of the instructions.   They were advised to call  back or seek an in-person evaluation if the symptoms worsen or if the condition fails to improve as anticipated.  [name] [name], LCSW   Depression  screen [facility] 2/9 03/24/2020 03/01/2020 02/09/2020 02/04/2019 11/05/2018  Decreased Interest 2 0 0 0 0  Down, Depressed, Hopeless 2 3 0 0 0  PHQ - 2 Score 4 3 0 0 0  Altered sleeping 2 1 - - -  Tired, decreased energy 1 3 - - -  Change in appetite 2 0 - - -  Feeling bad or failure about yourself  0 0 - - -  Trouble concentrating 0 1 - - -  Moving slowly or fidgety/restless 0 0 - - -  Suicidal thoughts 0 0 - - -  PHQ-9 Score 9 8 - - -  Difficult doing work/chores - Very difficult - - -   GAD 7 : Generalized Anxiety Score 03/24/2020  Nervous, Anxious, on Edge 2  Control/stop worrying 2  Worry too much - different things 2  Trouble relaxing 2  Restless 0  Easily annoyed or irritable 2  Afraid - awful might happen 1  Total GAD 7 Score 11

## 2020-04-01 ENCOUNTER — Ambulatory Visit
Admission: RE | Admit: 2020-04-01 | Discharge: 2020-04-01 | Disposition: A | Discharge status: Home / Self Care | Payer: BC Managed Care – PPO | Source: Ambulatory Visit | Attending: Registered Nurse | Admitting: Registered Nurse

## 2020-04-01 DIAGNOSIS — N83291 Other ovarian cyst, right side: Secondary | ICD-10-CM | POA: Diagnosis not present

## 2020-04-01 DIAGNOSIS — N83292 Other ovarian cyst, left side: Secondary | ICD-10-CM | POA: Diagnosis not present

## 2020-04-01 DIAGNOSIS — N83209 Unspecified ovarian cyst, unspecified side: Secondary | ICD-10-CM

## 2020-04-12 ENCOUNTER — Ambulatory Visit (INDEPENDENT_AMBULATORY_CARE_PROVIDER_SITE_OTHER): Payer: BC Managed Care – PPO | Admitting: Clinical

## 2020-04-12 DIAGNOSIS — F4323 Adjustment disorder with mixed anxiety and depressed mood: Secondary | ICD-10-CM | POA: Diagnosis not present

## 2020-04-12 NOTE — BH Specialist Note (Signed)
Integrated Behavioral Health via Telemedicine Visit  04/12/2020 Michelle Frey [id number]  Number of Integrated Behavioral Health visits: 2 Session Start time: 8:17  Session End time: 8:53 Total time: 44  Referring Provider: Mariel Aloe, MD Patient/Family location: Home [facility] Provider location: Center for [facility] at [facility]  All persons participating in visit: Patient Michelle Frey and [employer] Beau Vanduzer   Types of Service: Individual psychotherapy and Video visit  I connected with [name] and/or Loel Ro Baez's n/a via  Telephone or Video Enabled Telemedicine Application  (Video is Caregility application) and verified that I am speaking with the correct person using two identifiers. Discussed confidentiality: Yes   I discussed the limitations of telemedicine and the availability of in person appointments.  Discussed there is a possibility of technology failure and discussed alternative modes of communication if that failure occurs.  I discussed that engaging in this telemedicine visit, they consent to the provision of behavioral healthcare and the services will be billed under their insurance.  Patient and/or legal guardian expressed understanding and consented to Telemedicine visit: Yes   Presenting Concerns: Patient and/or family reports the following symptoms/concerns: Pt states her primary symptoms are fatigue (attributes to low vitamin D) and worry;  anxiety is decreasing, one day of panic last week; used breathing exercise to cope. Pt open to implementing additional self-coping strategy to manage worry.  Duration of problem: Increase over time; Severity of problem: moderate  Patient and/or Family's Strengths/Protective Factors: Social and Emotional competence, Concrete supports in place (healthy food, safe environments, etc.), Sense of purpose and Physical Health (exercise, healthy diet, medication compliance, etc.)  Goals  Addressed: Patient will: 1.  Reduce symptoms of: anxiety, depression and stress  2.  Increase knowledge and/or ability of: self-management skills  3.  Demonstrate ability to: Increase healthy adjustment to current life circumstances  Progress towards Goals: Ongoing  Interventions: Interventions utilized:  CBT Cognitive Behavioral Therapy Standardized Assessments completed: GAD-7 and PHQ 9  Patient and/or Family Response: Pt agrees with treatment plan; utilizing self-coping strategies effectively  Assessment: Patient currently experiencing Adjustment disorder with mixed anxious and depressed mood.   Patient may benefit from continued psychoeducation and brief therapeutic interventions regarding coping with symptoms of anxiety, depression, life stress .  Plan: 1. Follow up with behavioral health clinician on : Two weeks 2. Behavioral recommendations:  -Continue taking medications/vitamins as prescribed -Continue using CALM relaxation breathing exercise at least twice daily, and as needed throughout the day -Continue healthy communication with husband regarding expectations of one another -Begin using Worry Time strategy (set timer on phone for beginning and end time of choice)  3. Referral(s): Integrated [employer] (In Clinic)  I discussed the assessment and treatment plan with the patient and/or parent/guardian. They were provided an opportunity to ask questions and all were answered. They agreed with the plan and demonstrated an understanding of the instructions.   They were advised to call back or seek an in-person evaluation if the symptoms worsen or if the condition fails to improve as anticipated.  Rae Lips, LCSW   Depression screen [facility] 2/9 04/26/2020 03/24/2020 03/01/2020 02/09/2020 02/04/2019  Decreased Interest 1 2 0 0 0  Down, Depressed, Hopeless 1 2 3  0 0  PHQ - 2 Score 2 4 3  0 0  Altered sleeping 2 2 1  - -  Tired, decreased energy 1 1 3  - -   Change in appetite 0 2 0 - -  Feeling bad  or failure about yourself  1 0 0 - -  Trouble concentrating 0 0 1 - -  Moving slowly or fidgety/restless 0 0 0 - -  Suicidal thoughts 0 0 0 - -  PHQ-9 Score 6 9 8  - -  Difficult doing work/chores - - Very difficult - -   GAD 7 : Generalized Anxiety Score 04/26/2020 03/24/2020  Nervous, Anxious, on Edge 1 2  Control/stop worrying 0 2  Worry too much - different things 2 2  Trouble relaxing 1 2  Restless 0 0  Easily annoyed or irritable 1 2  Afraid - awful might happen 1 1  Total GAD 7 Score 6 11

## 2020-04-12 NOTE — Patient Instructions (Signed)
[facility] [address] [place], [place] [address] [phone] (main office) [phone] (Saga Balthazar's office)  Coping with Panic Attacks   What is a panic attack?  You may have had a panic attack if you experienced four or more of the symptoms listed below coming on abruptly and peaking in about 10 minutes.  Panic Symptoms   . Pounding heart  . Sweating  . Trembling or shaking  . Shortness of breath  . Feeling of choking  . Chest pain  . Nausea or abdominal distress    . Feeling dizzy, unsteady, lightheaded, or faint  . Feelings of unreality or being detached from yourself  . Fear of losing control or going crazy  . Fear of dying  . Numbness or tingling  . Chills or hot flashes      Panic attacks are sometimes accompanied by avoidance of certain places or situations. These are often situations that would be difficult to escape from or in which help might not be available. Examples might include crowded shopping malls, public transportation, restaurants, or driving.   Why do panic attacks occur?   Panic attacks are the body's alarm system gone awry. All of us have a built-in alarm system, powered by adrenaline, which increases our heart rate, breathing, and blood flow in response to danger. Ordinarily, this 'danger response system' works well. In some people, however, the response is either out of proportion to whatever stress is going on, or may come out of the blue without any stress at all.   For example, if you are walking in the woods and see a bear coming your way, a variety of changes occur in your body to prepare you to either fight the danger or flee from the situation. Your heart rate will increase to get more blood flow around your body, your breathing rate will quicken so that more oxygen is available, and your muscles will tighten in order to be ready to fight or run. You may feel nauseated as blood flow leaves your  stomach area and moves into your limbs. These bodily changes are all essential to helping you survive the dangerous situation. After the danger has passed, your body functions will begin to go back to normal. This is because your body also has a system for "recovering" by bringing your body back down to a normal state when the danger is over.   As you can see, the emergency response system is adaptive when there is, in fact, a "true" or "real" danger (e.g., bear). However, sometimes people find that their emergency response system is triggered in "everyday" situations where there really is no true physical danger (e.g., in a meeting, in the grocery store, while driving in normal traffic, etc.).   What triggers a panic attack?  Sometimes particularly stressful situations can trigger a panic attack. For example, an argument with your spouse or stressors at work can cause a stress response (activating the emergency response system) because you perceive it as threatening or overwhelming, even if there is no direct risk to your survival.  Sometimes panic attacks don't seem to be triggered by anything in particular- they may "come out of the blue". Somehow, the natural "fight or flight" emergency response system has gotten activated when there is no real danger. Why does the body go into "emergency mode" when there is no real danger?   Often, people with panic attacks are frightened or alarmed by the physical sensations of   the emergency response system. First, unexpected physical sensations are experienced (tightness in your chest or some shortness of breath). This then leads to feeling fearful or alarmed by these symptoms ("Something's wrong!", "Am I having a heart attack?", "Am I going to faint?") The mind perceives that there is a danger even though no real danger exists. This, in turn, activates the emergency response system ("fight or flight"), leading to a "full blown" panic attack. In summary, panic  attacks occur when we misinterpret physical symptoms as signs of impending death, craziness, loss of control, embarrassment, or fear of fear. Sometimes you may be aware of thoughts of danger that activate the emergency response system (for example, thinking "I'm having a heart attack" when you feel chest pressure or increased heart rate). At other times, however, you may not be aware of such thoughts. After several incidences of being afraid of physical sensations, anxiety and panic can occur in response to the initial sensations without conscious thoughts of danger. Instead, you just feel afraid or alarmed. In other words, the panic or fear may seem to occur "automatically" without you consciously telling yourself anything.   After having had one or more panic attacks, you may also become more focused on what is going on inside your body. You may scan your body and be more vigilant about noticing any symptoms that might signal the start of a panic attack. This makes it easier for panic attacks to happen again because you pick up on sensations you might otherwise not have noticed, and misinterpret them as something dangerous. A panic attack may then result.      How do I cope with panic attacks?  An important part of overcoming panic attacks involves re-interpreting your body's physical reactions and teaching yourself ways to decrease the physical arousal. This can be done through practicing the cognitive and behavioral interventions below.   Research has found that over half of people who have panic attacks show some signs of hyperventilation or overbreathing. This can produce initial sensations that alarm you and lead to a panic attack. Overbreathing can also develop as part of the panic attack and make the symptoms worse. When people hyperventilate, certain blood vessels in the body become narrower. In particular, the brain may get slightly less oxygen. This can lead to the symptoms of dizziness,  confusion, and lightheadedness that often occur during panic attacks. Other parts of the body may also get a bit less oxygen, which may lead to numbness or tingling in the hands or feet or the sensation of cold, clammy hands. It also may lead the heart to pump harder. Although these symptoms may be frightening and feel unpleasant, it is important to remember that hyperventilating is not dangerous. However, you can help overcome the unpleasantness of overbreathing by practicing Breathing Retraining.   Practice this basic technique three times a day, every day:  . Inhale. With your shoulders relaxed, inhale as slowly and deeply as you can while you count to six. If you can, use your diaphragm to fill your lungs with air.  . Hold. Keep the air in your lungs as you slowly count to four.  . Exhale. Slowly breath out as you count to six.  . Repeat. Do the inhale-hold-exhale cycle several times. Each time you do it, exhale for longer counts.  Like any new skill, Breathing Retraining requires practice. Try practicing this skill twice a day for several minutes. Initially, do not try this technique in specific situations or when you   become frightened or have a panic attack. Begin by practicing in a quiet environment to build up your skill level so that you can later use it in time of "emergency."   2. Decreasing Avoidance  Regardless of whether you can identify why you began having panic attacks or whether they seemed to come out of the blue, the places where you began having panic attacks often can become triggers themselves. It is not uncommon for individuals to begin to avoid the places where they have had panic attacks. Over time, the individual may begin to avoid more and more places, thereby decreasing their activities and often negatively impacting their quality of life. To break the cycle of avoidance, it is important to first identify the places or situations that are being avoided, and then to do some  "relearning."  To begin this intervention, first create a list of locations or situations that you tend to avoid. Then choose an avoided location or situation that you would like to target first. Now develop an "exposure hierarchy" for this situation or location. An "exposure hierarchy" is a list of actions that make you feel anxious in this situation. Order these actions from least to most anxiety-producing. It is often helpful to have the first item on your hierarchy involve thinking or imagining part of the feared/avoided situation.   Here is an example of an exposure hierarchy for decreasing avoidance of the grocery store. Note how it is ordered from the least amount of anxiety (at the top) to the most anxiety (at the bottom):  . Think about going to the grocery store alone.  . Go to the grocery store with a friend or family member.  . Go to the grocery store alone to pick up a few small items (5-10 minutes in the store).  . Shopping for 10-20 minutes in the store alone.  . Doing the shopping for the week by myself (20-30 minutes in the store).   Your homework is to "expose" yourself to the lowest item on your hierarchy and use your breathing relaxation and coping statements (see below) to help you remain in the situation. Practice this several times during the upcoming week. Once you have mastered each item with minimal anxiety, move on to the next higher action on your list.   Cognitive Interventions  1. Identify your negative self-talk Anxious thoughts can increase anxiety symptoms and panic. The first step in changing anxious thinking is to identify your own negative, alarming self-talk. Some common alarming thoughts:  . I'm having a heart attack.            . I must be going crazy. . I think I'm dying. . People will think I'm crazy. . I'm going to pass our.  . Oh no- here it comes.  . I can't stand this.  . I've got to get out of here!  2. Use positive coping statements Changing or  disrupting a pattern of anxious thoughts by replacing them with more calming or supportive statements can help to divert a panic attack. Some common helpful coping statements:  . This is not an emergency.  . I don't like feeling this way, but I can accept it.  . I can feel like this and still be okay.  . This has happened before, and I was okay. I'll be okay this time, too.  . I can be anxious and still deal with this situation.    /Emotional Wellbeing Apps and Websites Here are a few   free apps meant to help you to help yourself.  To find, try searching on the internet to see if the app is offered on Apple/Android devices. If your first choice doesn't come up on your device, the good news is that there are many choices! Play around with different apps to see which ones are helpful to you.    Calm This is an app meant to help increase calm feelings. Includes info, strategies, and tools for tracking your feelings.      Calm Harm  This app is meant to help with self-harm. Provides many 5-minute or 15-min coping strategies for doing instead of hurting yourself.       Healthy Minds Health Minds is a problem-solving tool to help deal with emotions and cope with stress you encounter wherever you are.      MindShift This app can help people cope with anxiety. Rather than trying to avoid anxiety, you can make an important shift and face it.      MY3  MY3 features a support system, safety plan and resources with the goal of offering a tool to use in a time of need.       My Life My Voice  This mood journal offers a simple solution for tracking your thoughts, feelings and moods. Animated emoticons can help identify your mood.       Relax Melodies Designed to help with sleep, on this app you can mix sounds and meditations for relaxation.      Smiling Mind Smiling Mind is meditation made easy: it's a simple tool that helps put a smile on your mind.        Stop, Breathe &  Think  A friendly, simple guide for people through meditations for mindfulness and compassion.  Stop, Breathe and Think Kids Enter your current feelings and choose a "mission" to help you cope. Offers videos for certain moods instead of just sound recordings.       Team Orange The goal of this tool is to help teens change how they think, act, and react. This app helps you focus on your own good feelings and experiences.      The Virtual Hope Box The Virtual Hope Box (VHB) contains simple tools to help patients with coping, relaxation, distraction, and positive thinking.     

## 2020-04-14 ENCOUNTER — Telehealth (INDEPENDENT_AMBULATORY_CARE_PROVIDER_SITE_OTHER): Payer: BC Managed Care – PPO | Admitting: Obstetrics and Gynecology

## 2020-04-14 ENCOUNTER — Encounter: Payer: Self-pay | Admitting: Obstetrics and Gynecology

## 2020-04-14 DIAGNOSIS — N83202 Unspecified ovarian cyst, left side: Secondary | ICD-10-CM | POA: Diagnosis not present

## 2020-04-14 NOTE — Progress Notes (Signed)
GYNECOLOGY VIRTUAL VISIT ENCOUNTER NOTE  Provider location: Center for [facility]   Patient location: Home  I connected with Michelle Frey on 04/14/20 at 10:55 AM EDT by MyChart Video Encounter and verified that I am speaking with the correct person using two identifiers.   I discussed the limitations, risks, security and privacy concerns of performing an evaluation and management service virtually and the availability of in person appointments. I also discussed with the patient that there may be a patient responsible charge related to this service. The patient expressed understanding and agreed to proceed.   History:  Michelle Frey is a 35 y.o. G17P1001 female being evaluated today for resolution of left ovarian cysts and pelvic pain. She denies any abnormal vaginal discharge, bleeding, pelvic pain or other concerns.    Discussed u/s findings noting cyst resolution on the left and new cyst in the right adnexa.  In discussion pt noted she and her spouse having been trying for pregnancy for over a year.  Pt advised she may want to consider appointment for fertility discussion and spouse may need semenanalysis.     Past Medical History:  Diagnosis Date  . COVID-19 virus infection 11/2019  . Overweight (BMI 25.0-29.9)    No past surgical history on file. The following portions of the patient's history were reviewed and updated as appropriate: allergies, current medications, past family history, past medical history, past social history, past surgical history and problem list.   Health Maintenance:  Normal pap and negative HRHPV on 03/24/20.     Review of Systems:  Pertinent items noted in HPI and remainder of comprehensive ROS otherwise negative.  Physical Exam:   General:  Alert, oriented and cooperative. Patient appears to be in no acute distress.  Mental Status: Normal mood and affect. Normal behavior. Normal judgment and thought content.    Respiratory: Normal respiratory effort, no problems with respiration noted  Rest of physical exam deferred due to type of encounter  Labs and Imaging No results found for this or any previous visit (from the past 336 hour(s)). US Pelvic Complete With Transvaginal  Result Date: 04/02/2020 CLINICAL DATA:  One month follow-up.  Continued pelvic pain. EXAM: TRANSABDOMINAL AND TRANSVAGINAL ULTRASOUND OF PELVIS TECHNIQUE: Both transabdominal and transvaginal ultrasound examinations of the pelvis were performed. Transabdominal technique was performed for global imaging of the pelvis including uterus, ovaries, adnexal regions, and pelvic cul-de-sac. It was necessary to proceed with endovaginal exam following the transabdominal exam to visualize the ovaries. COMPARISON:  Ultrasound dated 02/19/2020.  CT dated 03/05/2020 FINDINGS: Uterus Measurements: 8.7 x 4.1 x 4.7 cm = volume: 87 mL. No fibroids or other mass visualized. Endometrium Thickness: 10 mm.  No focal abnormality visualized. Right ovary Measurements: 3.8 x 2.5 x 2.7 cm = volume: 13.3 mL. There is a slightly complex cystic structure involving the right ovary measuring approximately 2.1 x 2.4 cm. The cystic structure contains a thin internal septation. This is almost certainly benign. Left ovary Measurements: 2.6 x 1.7 x 2.2 cm = volume: 4.8 mL. Normal appearance/no adnexal mass. Other findings No abnormal free fluid. IMPRESSION: No significant abnormality. The previously demonstrated left ovarian cystic structures have resolved. Electronically Signed   By: Katherine Mantle M.D.   On: 04/02/2020 13:47       Assessment and Plan:     Left ovarian cyst      I discussed the assessment and treatment plan with the patient. The patient was provided  an opportunity to ask questions and all were answered. The patient agreed with the plan and demonstrated an understanding of the instructions.  Pt was advised expectant management for ovarian cyst.  OCP would  be a preventative measure, but this is contraindicated as she is trying to get pregnant.  Pt may schedule follow up for fertility discussion at her leisure.   The patient was advised to call back or seek an in-person evaluation/go to the ED if the symptoms worsen or if the condition fails to improve as anticipated.  I spent 10 minutes dedicated to the care of this patient including previsit review of records, face to face time with the patient discussing results, treatment plan and post visit testing.     Warden Fillers, MD Center for [employer], [facility]

## 2020-04-23 ENCOUNTER — Encounter: Payer: Self-pay | Admitting: Obstetrics and Gynecology

## 2020-04-26 ENCOUNTER — Ambulatory Visit: Payer: BC Managed Care – PPO | Admitting: Clinical

## 2020-04-26 DIAGNOSIS — F4323 Adjustment disorder with mixed anxiety and depressed mood: Secondary | ICD-10-CM

## 2020-04-26 NOTE — BH Specialist Note (Signed)
Integrated Behavioral Health via Telemedicine Visit  04/26/2020 Michelle Frey [id number]  Number of Integrated Behavioral Health visits: 3 Session Start time: 8:19  Session End time: 8:56 Total time: 37  Referring Provider: Mariel Aloe, MD Patient/Family location: Home [facility] Provider location: [facility] for [facility]  All persons participating in visit: Patient Michelle Frey and [facility] Michelle Frey   Types of Service: Individual psychotherapy and Video visit  I connected with [name] and/or Loel Ro Plasencia's n/a via  Telephone or Video Enabled Telemedicine Application  (Video is Caregility application) and verified that I am speaking with the correct person using two identifiers. Discussed confidentiality: Yes   I discussed the limitations of telemedicine and the availability of in person appointments.  Discussed there is a possibility of technology failure and discussed alternative modes of communication if that failure occurs.  I discussed that engaging in this telemedicine visit, they consent to the provision of behavioral healthcare and the services will be billed under their insurance.  Patient and/or legal guardian expressed understanding and consented to Telemedicine visit: Yes   Presenting Concerns: Patient and/or family reports the following symptoms/concerns: Pt states feeling " a little up and down", "nervous" and "emotional" the past week, having a difficult time slowing down during work and caring for others.  Duration of problem: Increasing over time; Severity of problem: moderate  Patient and/or Family's Strengths/Protective Factors: Social and Emotional competence, Concrete supports in place (healthy food, safe environments, etc.), Sense of purpose and Physical Health (exercise, healthy diet, medication compliance, etc.)  Goals Addressed: Patient will: 1.  Reduce symptoms of: anxiety, depression and stress  2.  Increase  knowledge and/or ability of: self-management skills  3.  Demonstrate ability to: Increase healthy adjustment to current life circumstances  Progress towards Goals: Ongoing  Interventions: Interventions utilized:  Mindfulness or Management consultant and Psychoeducation and/or Health Education Standardized Assessments completed: Not Needed  Patient and/or Family Response: Pt agrees to treatment plan, revised; using relaxation breathing exercises effectively  Assessment: Patient currently experiencing Adjustment disorder with mixed anxious and depressed mood.   Patient may benefit from continued psychoeducation and brief therapeutic interventions regarding coping with symptoms of anxiety, depression, stress .  Plan: 1. Follow up with behavioral health clinician on : One month 2. Behavioral recommendations:  -Continue using CALM relaxation breathing exercise daily -Continue taking medications/vitamins as prescribed -Continue with healthy communication with husband/expections -Plan ahead for next menstrual cycle coming up: mark on calendar and plan to utilize extra self-care measures the week prior to expected menses, as well as during expected ovulation days -Keep track of mood on app already on phone; consider additional apps (on After Visit Summary) to use throughout the day as needed  -Consider one work break daily (at least 5 minutes of self-care time, not time for others); consider using grounding strategy or meditation app for improved focus  3. Referral(s): Integrated [employer] (In Clinic)  I discussed the assessment and treatment plan with the patient and/or parent/guardian. They were provided an opportunity to ask questions and all were answered. They agreed with the plan and demonstrated an understanding of the instructions.   They were advised to call back or seek an in-person evaluation if the symptoms worsen or if the condition fails to improve as  anticipated.  [name] Monserrath Junio, LCSW

## 2020-05-10 ENCOUNTER — Ambulatory Visit (INDEPENDENT_AMBULATORY_CARE_PROVIDER_SITE_OTHER): Payer: BC Managed Care – PPO | Admitting: Clinical

## 2020-05-10 DIAGNOSIS — F4323 Adjustment disorder with mixed anxiety and depressed mood: Secondary | ICD-10-CM

## 2020-05-10 NOTE — Patient Instructions (Signed)
Center for [facility] [address] [place], [place] [address] [phone] (main office) [phone] (Rawlin Reaume's office)  Grounding strategy:  * 5 things I see * 4 things I hear * 3 things I feel * 2 things I smell * 1 thing I taste  /Emotional Wellbeing Apps and Websites Here are a few free apps meant to help you to help yourself.  To find, try searching on the internet to see if the app is offered on Apple/Android devices. If your first choice doesn't come up on your device, the good news is that there are many choices! Play around with different apps to see which ones are helpful to you.    Calm This is an app meant to help increase calm feelings. Includes info, strategies, and tools for tracking your feelings.      Calm Harm  This app is meant to help with self-harm. Provides many 5-minute or 15-min coping strategies for doing instead of hurting yourself.       Healthy Minds Health Minds is a problem-solving tool to help deal with emotions and cope with stress you encounter wherever you are.      MindShift This app can help people cope with anxiety. Rather than trying to avoid anxiety, you can make an important shift and face it.      MY3  MY3 features a support system, safety plan and resources with the goal of offering a tool to use in a time of need.       My Life My Voice  This mood journal offers a simple solution for tracking your thoughts, feelings and moods. Animated emoticons can help identify your mood.       Relax Melodies Designed to help with sleep, on this app you can mix sounds and meditations for relaxation.      Smiling Mind Smiling Mind is meditation made easy: it's a simple tool that helps put a smile on your mind.      Stop, Breathe & Think  A friendly, simple guide for people through meditations for mindfulness and compassion.       Team Orange The goal of this tool is to help teens  change how they think, act, and react. This app helps you focus on your own good feelings and experiences.      The [employer] Box The [employer] Box (VHB) contains simple tools to help patients with coping, relaxation, distraction, and positive thinking.

## 2020-05-30 ENCOUNTER — Other Ambulatory Visit: Payer: Self-pay | Admitting: Registered Nurse

## 2020-05-30 DIAGNOSIS — E559 Vitamin D deficiency, unspecified: Secondary | ICD-10-CM

## 2020-05-31 NOTE — BH Specialist Note (Deleted)
Integrated Behavioral Health via Telemedicine Visit  05/31/2020 JARAE NEMMERS [id number]  Number of Integrated Behavioral Health visits: *** Session Start time: ***  Session End time: *** Total time: {IBH Total ZESP:23300762}  Referring Provider: *** Patient/Family location: *** [facility] Provider location: *** All persons participating in visit: *** Types of Service: {CHL AMB TYPE OF SERVICE:[phone]}  I connected with [name] and/or Loel Ro Demartini's {family members:20773} via  Telephone or Video Enabled Telemedicine Application  (Video is Caregility application) and verified that I am speaking with the correct person using two identifiers. Discussed confidentiality: {YES/NO:21197}  I discussed the limitations of telemedicine and the availability of in person appointments.  Discussed there is a possibility of technology failure and discussed alternative modes of communication if that failure occurs.  I discussed that engaging in this telemedicine visit, they consent to the provision of behavioral healthcare and the services will be billed under their insurance.  Patient and/or legal guardian expressed understanding and consented to Telemedicine visit: {YES/NO:21197}  Presenting Concerns: Patient and/or family reports the following symptoms/concerns: *** Duration of problem: ***; Severity of problem: {Mild/Moderate/Severe:20260}  Patient and/or Family's Strengths/Protective Factors: {CHL AMB BH PROTECTIVE [phone]}  Goals Addressed: Patient will: 1.  Reduce symptoms of: {IBH Symptoms:21014056}  2.  Increase knowledge and/or ability of: {IBH Patient Tools:21014057}  3.  Demonstrate ability to: {IBH Goals:21014053}  Progress towards Goals: {CHL AMB BH PROGRESS TOWARDS GOALS:[phone]}  Interventions: Interventions utilized:  {IBH Interventions:21014054} Standardized Assessments completed: {IBH Screening Tools:21014051}  Patient and/or Family Response:  ***  Assessment: Patient currently experiencing ***.   Patient may benefit from ***.  Plan: 1. Follow up with behavioral health clinician on : *** 2. Behavioral recommendations: *** 3. Referral(s): {IBH Referrals:21014055}  I discussed the assessment and treatment plan with the patient and/or parent/guardian. They were provided an opportunity to ask questions and all were answered. They agreed with the plan and demonstrated an understanding of the instructions.   They were advised to call back or seek an in-person evaluation if the symptoms worsen or if the condition fails to improve as anticipated.  [name] Summer Parthasarathy, LCSW

## 2020-06-01 ENCOUNTER — Other Ambulatory Visit: Payer: Self-pay | Admitting: Registered Nurse

## 2020-06-01 DIAGNOSIS — S060X0A Concussion without loss of consciousness, initial encounter: Secondary | ICD-10-CM

## 2020-09-10 ENCOUNTER — Ambulatory Visit (HOSPITAL_COMMUNITY)
Admission: EM | Admit: 2020-09-10 | Discharge: 2020-09-10 | Disposition: A | Discharge status: Home / Self Care | Payer: BC Managed Care – PPO | Attending: Family Medicine | Admitting: Family Medicine

## 2020-09-10 ENCOUNTER — Other Ambulatory Visit: Payer: Self-pay

## 2020-09-10 ENCOUNTER — Encounter (HOSPITAL_COMMUNITY): Payer: Self-pay | Admitting: Emergency Medicine

## 2020-09-10 DIAGNOSIS — S8392XA Sprain of unspecified site of left knee, initial encounter: Secondary | ICD-10-CM

## 2020-09-10 DIAGNOSIS — Z3202 Encounter for pregnancy test, result negative: Secondary | ICD-10-CM | POA: Diagnosis not present

## 2020-09-10 DIAGNOSIS — N939 Abnormal uterine and vaginal bleeding, unspecified: Secondary | ICD-10-CM

## 2020-09-10 LAB — POC URINE PREG, ED: Preg Test, Ur: NEGATIVE

## 2020-09-10 NOTE — ED Provider Notes (Signed)
[facility]    CSN: [id number] Arrival date & time: 09/10/20  0818      History   Chief Complaint Chief Complaint  Patient presents with   Knee Pain    HPI Michelle Frey is a 35 y.o. female.   Patient presenting today for evaluation of left knee pain and lateral swelling after twisting the knee on a walk this morning.  She states initially it was making popping sound when she tried to move the knee but that has improved at this time.  Some mild swelling present, no bruising, redness, numbness, tingling, weakness.  She is so far not taking anything over-the-counter for symptoms as the issue just occurred this morning.  No past history of orthopedic injuries to the knee.  She is also requesting a pregnancy test as she started her period on time yesterday but has only been spotting and this is very abnormal for her.  Not currently on any sort of contraception and has had unprotected sex recently.  No vaginal pain, pelvic pain, vaginal discharge, dysuria, hematuria.   Past Medical History:  Diagnosis Date   COVID-19 virus infection 11/2019   Overweight (BMI 25.0-29.9)     Patient Active Problem List   Diagnosis Date Noted   Dysmenorrhea 03/24/2020   Ovarian cyst 03/24/2020   Overweight (BMI 25.0-29.9)    COVID-19 virus infection 11/2019    History reviewed. No pertinent surgical history.  OB History     Gravida  1   Para  1   Term  1   Preterm  0   AB  0   Living         SAB  0   IAB  0   Ectopic  0   Multiple      Live Births               Home Medications    Prior to Admission medications   Medication Sig Start Date End Date Taking? Authorizing Provider  ibuprofen (ADVIL) 800 MG tablet Take 1 tablet (800 mg total) by mouth 3 (three) times daily with meals as needed for headache, moderate pain or cramping. 03/24/20   Warden Fillers, MD  Multiple Vitamin (MULTIVITAMIN WITH MINERALS) TABS Take 1 tablet by mouth daily.    [provider]  nortriptyline (PAMELOR) 10 MG capsule TAKE 1 CAPSULE(10 MG) BY MOUTH AT BEDTIME 06/01/20   Janeece Agee, NP  Vitamin D, Ergocalciferol, (DRISDOL) 1.25 MG (50000 UNIT) CAPS capsule Take 1 capsule (50,000 Units total) by mouth every 7 (seven) days. 03/01/20   Janeece Agee, NP    Family History Family History  Problem Relation Age of Onset   Hyperlipidemia Mother    Pancreatic cancer Father    Glaucoma Sister    Alzheimer's disease Maternal Grandmother    Glaucoma Paternal Grandmother     Social History Social History   Tobacco Use   Smoking status: Some Days    Types: E-cigarettes   Smokeless tobacco: Never  Vaping Use   Vaping Use: Never used  Substance Use Topics   Alcohol use: Yes    Comment: rarely   Drug use: No     Allergies   Amoxicillin and Penicillins   Review of Systems Review of Systems Per HPI  Physical Exam Triage Vital Signs ED Triage Vitals  Enc Vitals Group     BP 09/10/20 0828 118/76     Pulse Rate 09/10/20 0828 92     Resp  09/10/20 0828 17     Temp 09/10/20 0828 98.7 F (37.1 C)     Temp Source 09/10/20 0828 Oral     SpO2 09/10/20 0828 97 %     Weight --      Height --      Head Circumference --      Peak Flow --      Pain Score 09/10/20 0829 6     Pain Loc --      Pain Edu? --      Excl. in GC? --    No data found.  Updated Vital Signs BP 118/76 (BP Location: Left Arm)   Pulse 92   Temp 98.7 F (37.1 C) (Oral)   Resp 17   LMP 08/09/2020   SpO2 97%   Visual Acuity Right Eye Distance:   Left Eye Distance:   Bilateral Distance:    Right Eye Near:   Left Eye Near:    Bilateral Near:     Physical Exam Vitals and nursing note reviewed.  Constitutional:      Appearance: Normal appearance. She is not ill-appearing.  HENT:     Head: Atraumatic.  Eyes:     Extraocular Movements: Extraocular movements intact.     Conjunctiva/sclera: Conjunctivae normal.  Cardiovascular:     Rate and Rhythm: Normal rate  and regular rhythm.     Heart sounds: Normal heart sounds.  Pulmonary:     Effort: Pulmonary effort is normal.     Breath sounds: Normal breath sounds.  Abdominal:     General: Bowel sounds are normal. There is no distension.     Palpations: Abdomen is soft.     Tenderness: There is no abdominal tenderness. There is no right CVA tenderness, left CVA tenderness or guarding.  Genitourinary:    Comments: GU exam deferred Musculoskeletal:        General: Swelling, tenderness and signs of injury present. No deformity. Normal range of motion.     Cervical back: Normal range of motion and neck supple.     Comments: Localized left lateral knee swelling in the soft tissues.  Nontender in either joint line of the left knee, no crepitus with passive range of motion.  Very mildly antalgic gait.  Negative McMurray's testing and drawer testing  Skin:    General: Skin is warm and dry.     Findings: No bruising or erythema.  Neurological:     Mental Status: She is alert and oriented to person, place, and time.     Comments: Left lower extremity neurovascular intact  Psychiatric:        Mood and Affect: Mood normal.        Thought Content: Thought content normal.        Judgment: Judgment normal.     UC Treatments / Results  Labs (all labs ordered are listed, but only abnormal results are displayed) Labs Reviewed  POC URINE PREG, ED    EKG   Radiology No results found.  Procedures Procedures (including critical care time)  Medications Ordered in UC Medications - No data to display  Initial Impression / Assessment and Plan / UC Course  I have reviewed the triage vital signs and the nursing notes.  Pertinent labs & imaging results that were available during my care of the patient were reviewed by me and considered in my medical decision making (see chart for details).     Suspect soft tissue strain in the left knee.  No evidence  of a bony abnormality or injury on exam so x-ray  imaging deferred with shared decision making.  Will place in a knee sleeve, discussed RICE protocol, over-the-counter pain relievers.  Urine pregnancy negative today.  Discussed continuing to monitor, GYN follow-up if ongoing irregularities occur.  Safe sexual practices reviewed.  Final Clinical Impressions(s) / UC Diagnoses   Final diagnoses:  Sprain of left knee, unspecified ligament, initial encounter  Negative pregnancy test  Vaginal spotting   Discharge Instructions   None    ED Prescriptions   None    PDMP not reviewed this encounter.   Roosvelt Maser [place], [place] 09/10/20 [phone]

## 2020-09-10 NOTE — ED Triage Notes (Signed)
Pt reports was walking her dogs this morning when running back to house one dog clipper her left leg causing her to fall, twisting her left knee. Reports did have popping with extension and pain radiating dow to foot from knee.

## 2020-10-21 DIAGNOSIS — L650 Telogen effluvium: Secondary | ICD-10-CM | POA: Diagnosis not present

## 2020-10-21 DIAGNOSIS — L218 Other seborrheic dermatitis: Secondary | ICD-10-CM | POA: Diagnosis not present

## 2020-11-18 DIAGNOSIS — H10013 Acute follicular conjunctivitis, bilateral: Secondary | ICD-10-CM | POA: Diagnosis not present

## 2021-02-03 ENCOUNTER — Ambulatory Visit (INDEPENDENT_AMBULATORY_CARE_PROVIDER_SITE_OTHER): Payer: BC Managed Care – PPO | Admitting: Obstetrics and Gynecology

## 2021-02-03 ENCOUNTER — Encounter: Payer: Self-pay | Admitting: Obstetrics and Gynecology

## 2021-02-03 ENCOUNTER — Other Ambulatory Visit: Payer: Self-pay

## 2021-02-03 DIAGNOSIS — N926 Irregular menstruation, unspecified: Secondary | ICD-10-CM | POA: Diagnosis not present

## 2021-02-03 DIAGNOSIS — Z3009 Encounter for other general counseling and advice on contraception: Secondary | ICD-10-CM

## 2021-02-03 MED ORDER — NORETHIN ACE-ETH ESTRAD-FE 1-20 MG-MCG(24) PO TABS: 1.0000 | ORAL_TABLET | Freq: Every day | ORAL | 3 refills | Status: DC

## 2021-02-03 NOTE — Progress Notes (Signed)
° °  Subjective:    Patient ID: Michelle Frey, female    DOB: [birth year], 36 y.o.   MRN: [id number]  HPI 36 yo G1P1 seen for discussion of menses.  Pt notes her menses are essentially regular, but the duration and amount of bleeding is different.  Pt denies any heavy bleeding, in fact, she was concerned because she felt her menses were too light.  She also thought since she had stopped her OCPs that her acne had gotten worse.  She was concerned about a "cyst" in the vuvla, but it is intermittent and not present currently.   Review of Systems     Objective:   Physical Exam Vitals:   02/03/21 0825  BP: 113/79  Pulse: 88         Assessment & Plan:   1. Encounter for management of menstrual issue Pt has agreed to restart her OCPs.  Will evaluate cycle control at next visit in 4 months with her annual exam.  - Norethindrone Acetate-Ethinyl Estrad-FE (LOESTRIN 24 FE) 1-20 MG-MCG(24) tablet; Take 1 tablet by mouth daily.  Dispense: 84 tablet; Refill: 3  2. Counseling for birth control, oral contraceptives  - Norethindrone Acetate-Ethinyl Estrad-FE (LOESTRIN 24 FE) 1-20 MG-MCG(24) tablet; Take 1 tablet by mouth daily.  Dispense: 84 tablet; Refill: 3  F/u in 4 months for annual exam.  I spent 10 minutes dedicated to the care of this patient including previsit review of records, face to face time with the patient discussing her history, treatment options and post visit testing.   Warden Fillers, MD Faculty Attending, Center for [facility]

## 2021-05-18 IMAGING — US US PELVIS COMPLETE WITH TRANSVAGINAL
1 series · 13 of 25 positions shown · non-contrast
Comparison: None available.

CLINICAL DATA: Initial evaluation for left-sided pelvic pain for 2
months.



[Series 1: us pelvis complete with transvaginal · 0.19mm/px · 130 acquisitions, 13 frames shown]
[im 1/130]
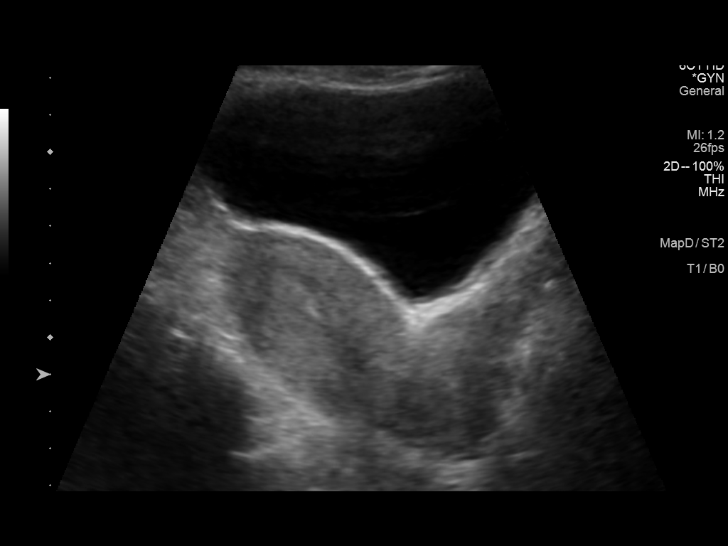
[im 11/130]
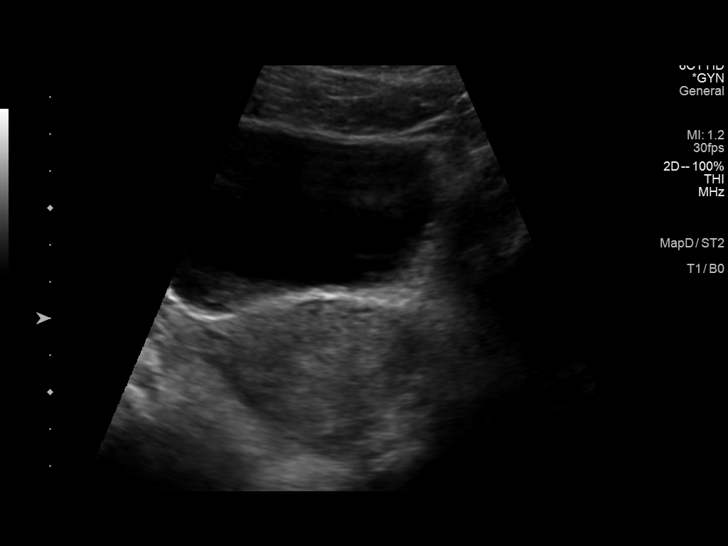
[im 22/130]
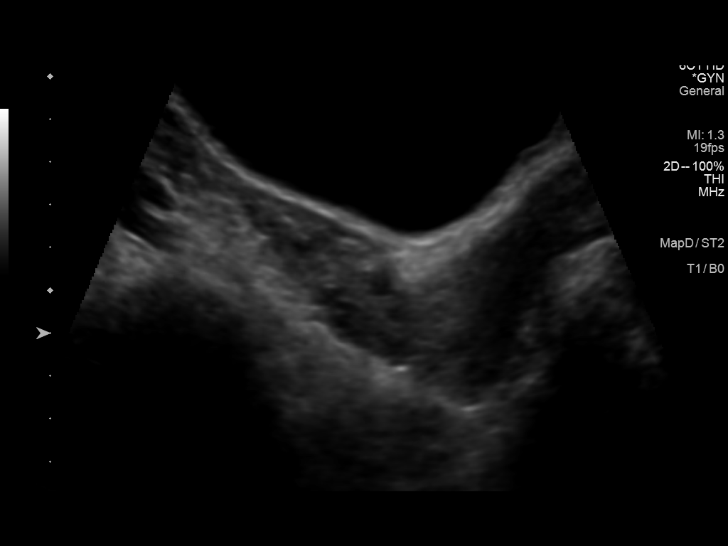
[im 33/130]
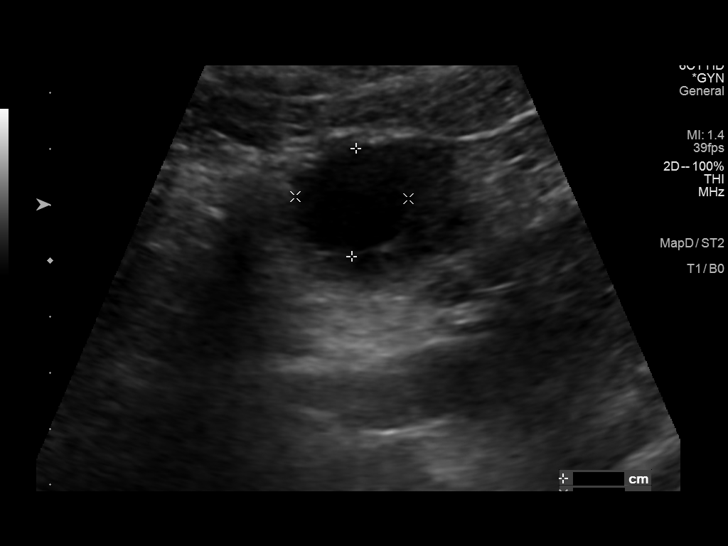
[im 44/130]
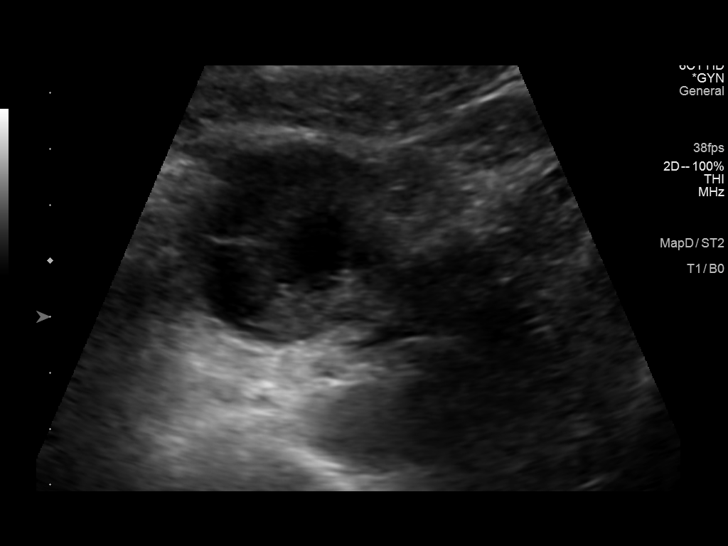
[im 54/130]
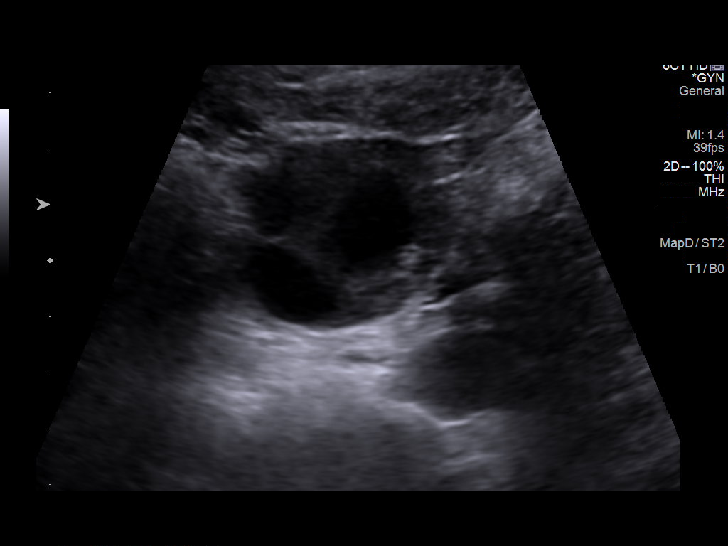
[im 65/130]
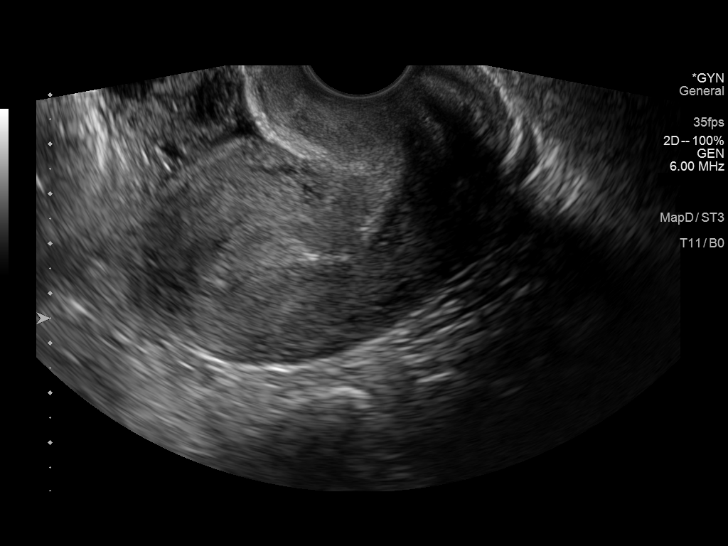
[im 76/130]
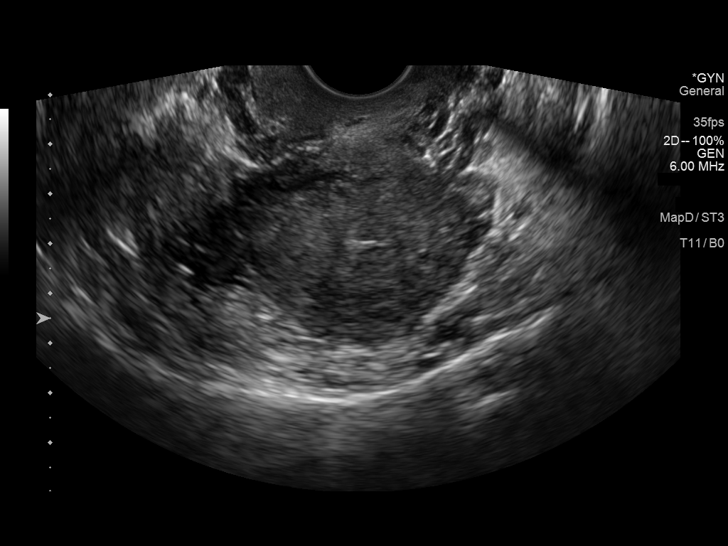
[im 87/130]
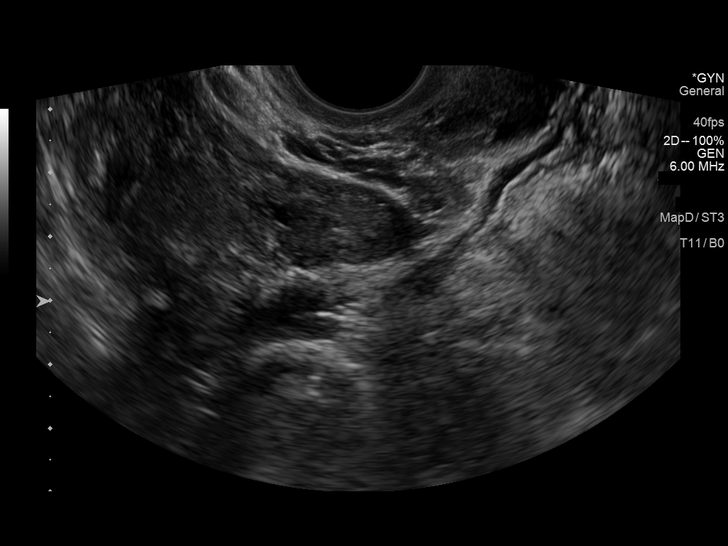
[im 97/130]
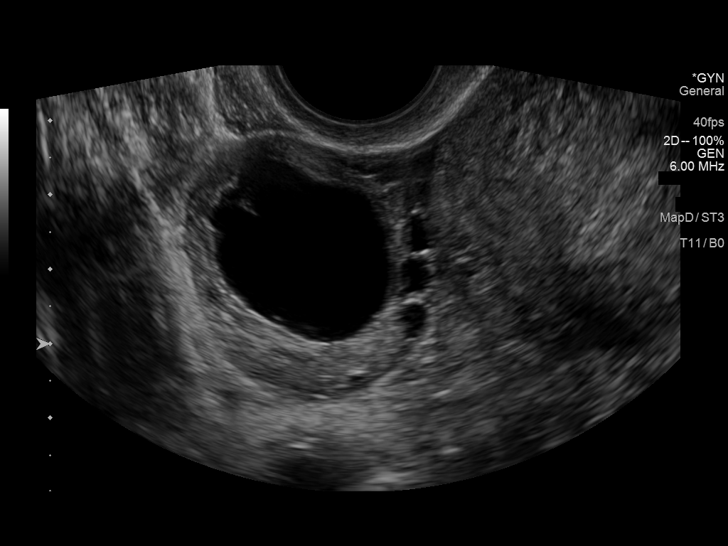
[im 108/130]
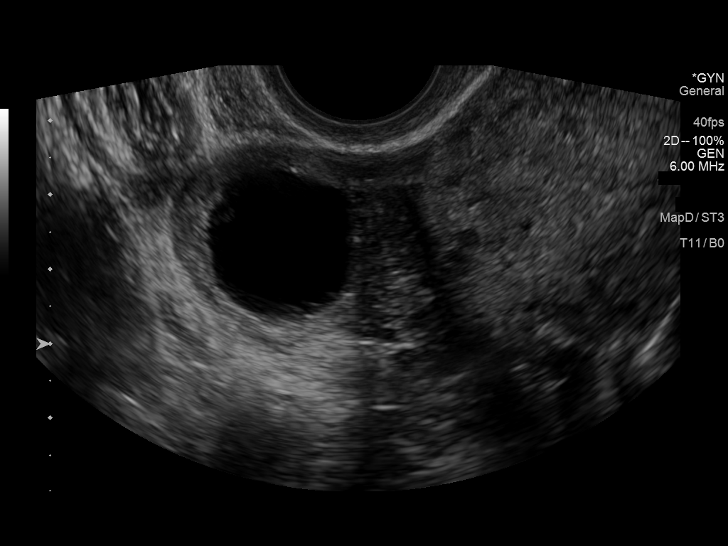
[im 119/130]
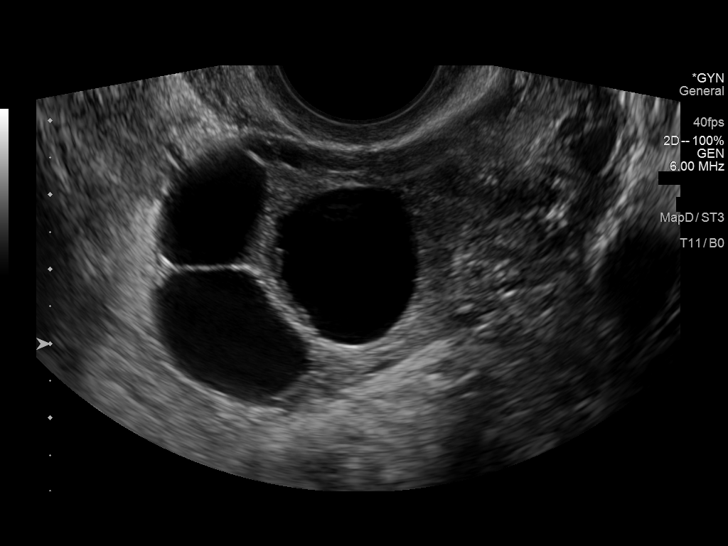
[im 130/130]
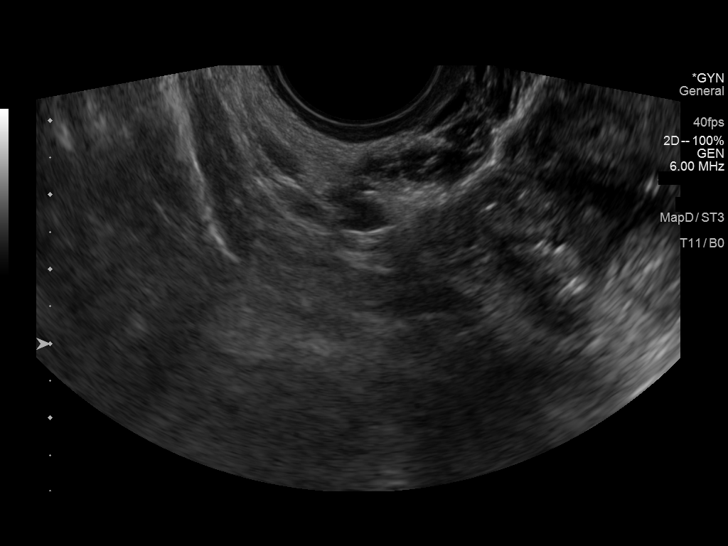

[13 of 25 positions shown; findings below may reference images not displayed]

FINDINGS: Uterus

Measurements: 8.7 x 3.6 x 4.8 cm = volume: 77.6 mL. Uterus is
anteverted. No discrete fibroid or other mass.

Endometrium

Thickness: 7.7 mm.  No focal abnormality visualized.

Right ovary

Measurements: 4.9 x 2.4 x 2.5 cm = volume: 15.3 mL. Normal
appearance/no adnexal mass.

Left ovary

Measurements: 4.7 x 4.0 x 3.6 cm = volume: 34.8 mL. 2.3 x 1.8 x
cm cyst seen within the left ovary. Lesion is minimally complex with
a few scattered low-level internal echoes. No appreciable
vascularity or solid nodularity. Adjacent 2.1 x 1.5 x 2.0 cm
predominantly simple cyst noted. A third 1.9 x 1.6 x 1.5 cm simple
cyst noted as well.

Other findings

No abnormal free fluid.
IMPRESSION: 1. Three left ovarian cysts measuring up to 2.3 cm, 1 of which is
minimally complex with a few scattered low-level internal echoes.
While these are likely benign, a short interval follow-up ultrasound
in 6-12 weeks to ensure resolution is recommended.
2. Otherwise unremarkable and normal pelvic ultrasound.

## 2021-06-29 IMAGING — US US PELVIS COMPLETE WITH TRANSVAGINAL
1 series · 13 of 25 positions shown · non-contrast
Comparison: Ultrasound dated 02/19/2020.  CT dated 03/05/2020

CLINICAL DATA: One month follow-up.  Continued pelvic pain.



[Series 1: us pelvis complete with transvaginal · 0.22mm/px · 46 acquisitions, 13 frames shown]
[im 1/46]
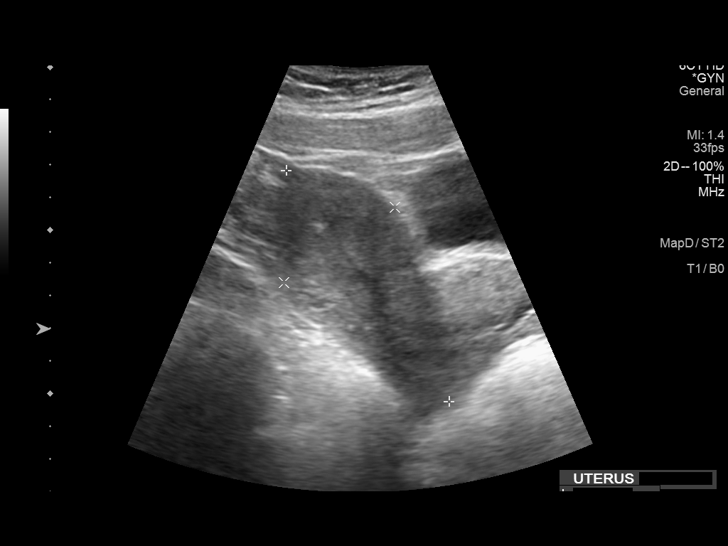
[im 4/46]
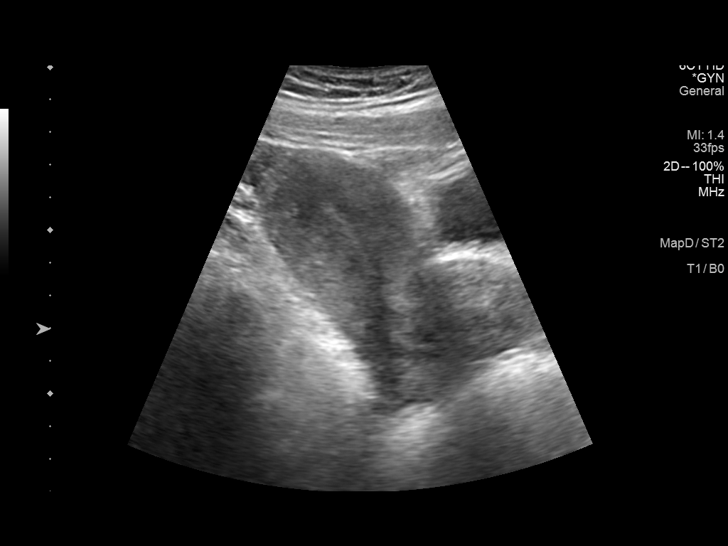
[im 8/46]
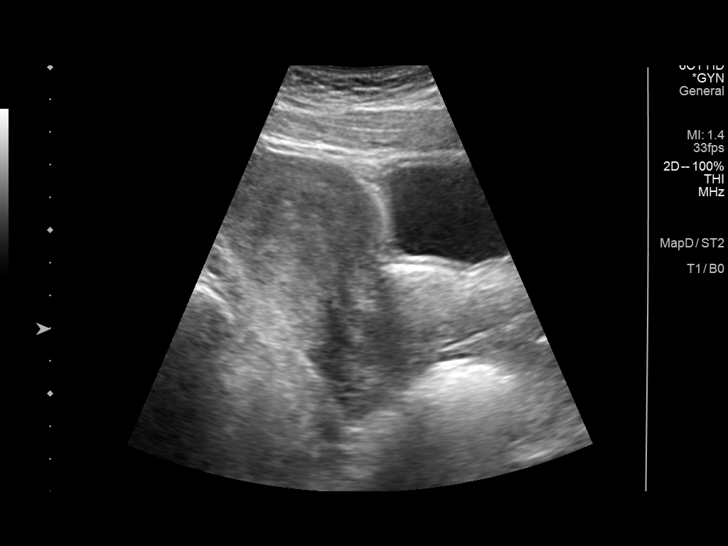
[im 12/46]
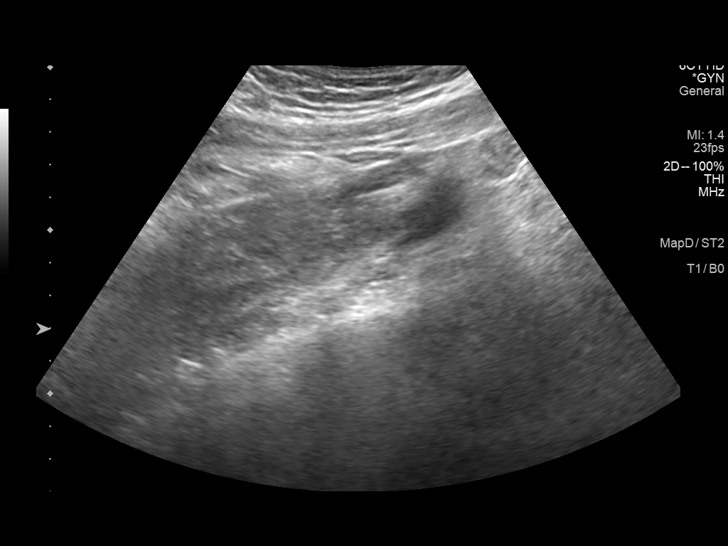
[im 16/46]
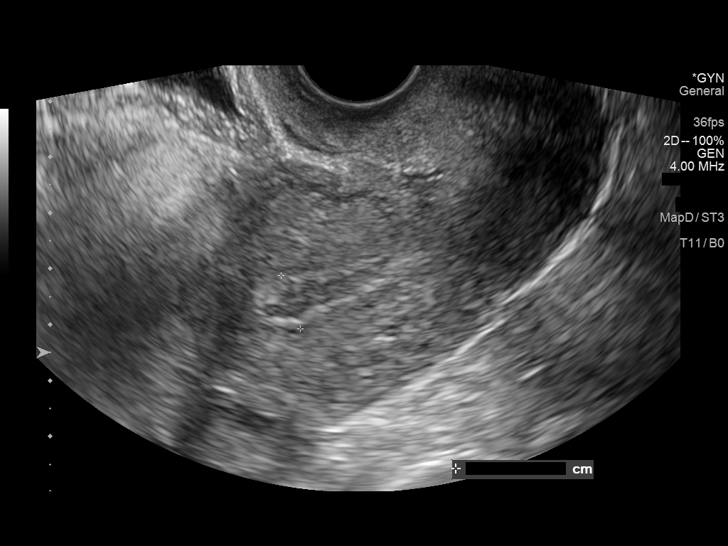
[im 19/46]
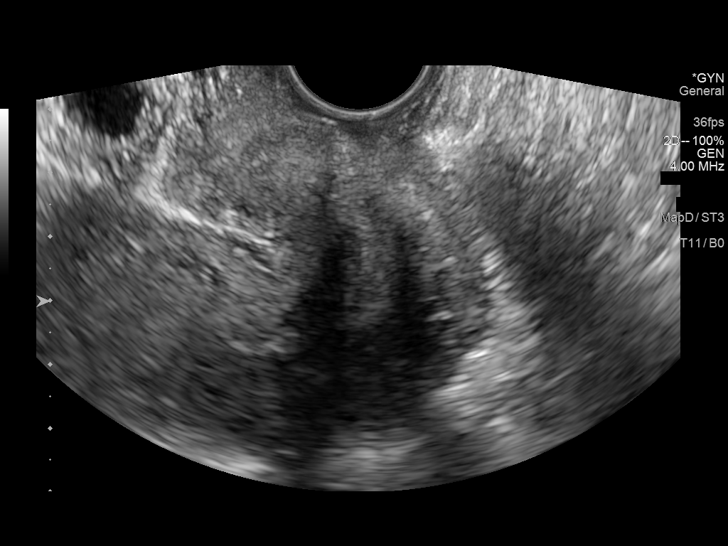
[im 23/46]
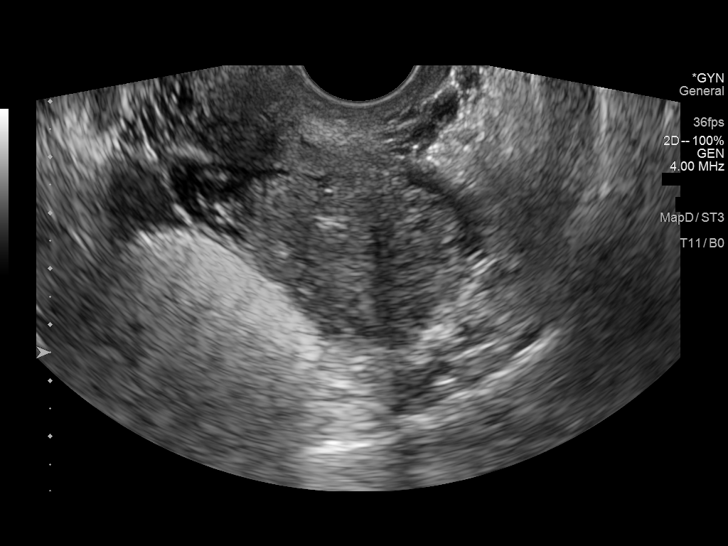
[im 27/46]
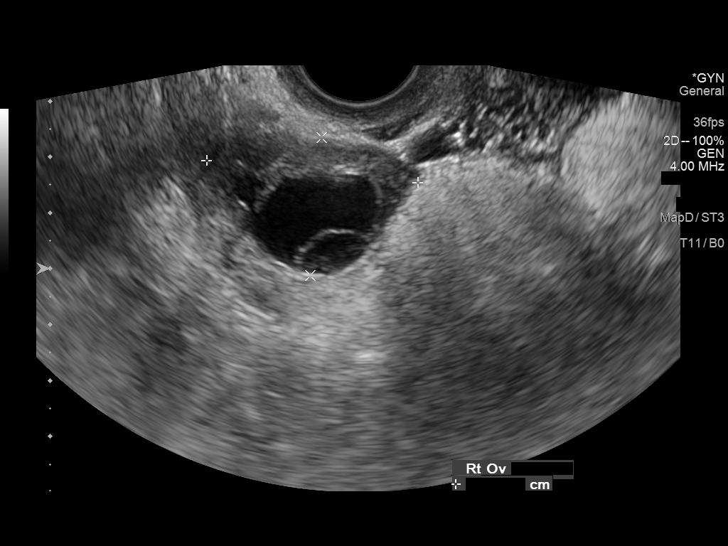
[im 31/46]
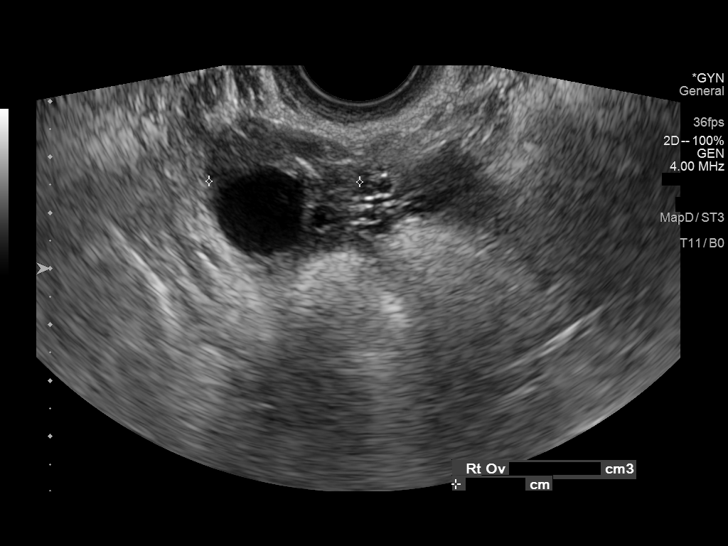
[im 34/46]
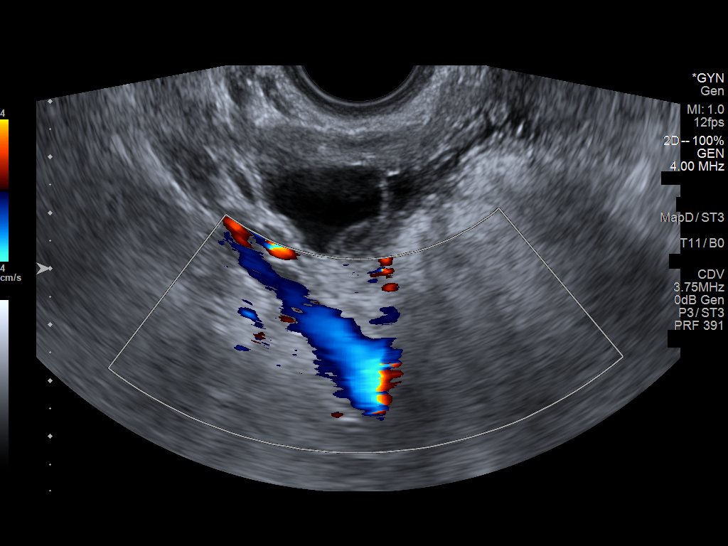
[im 38/46]
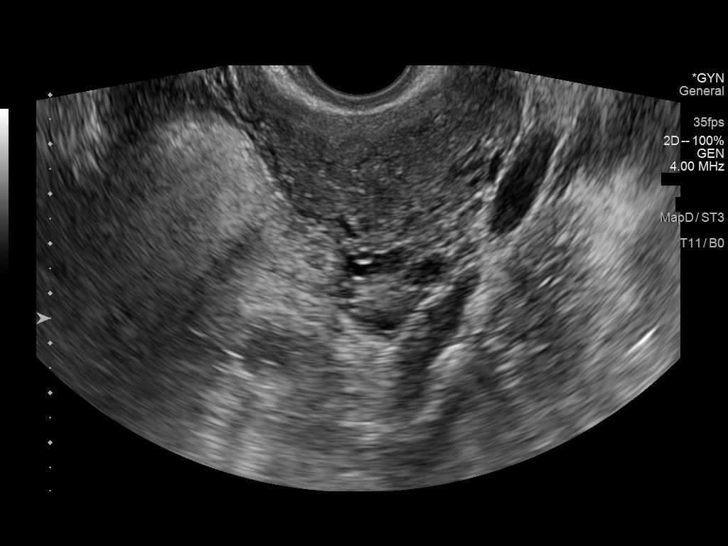
[im 42/46]
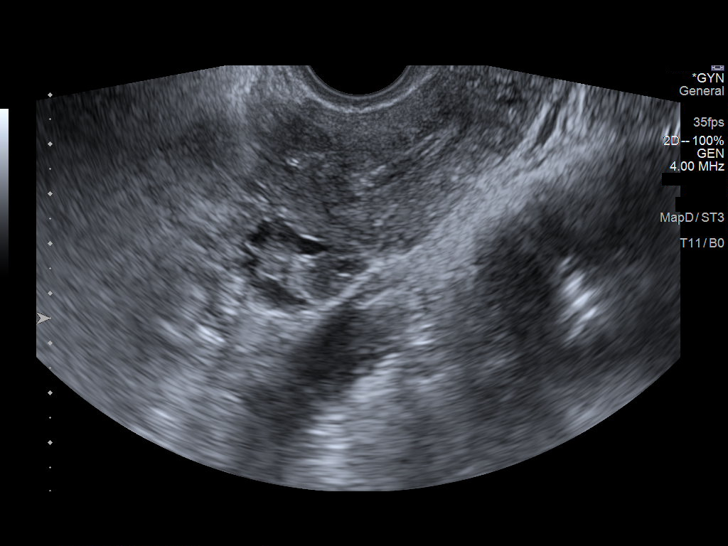
[im 46/46]
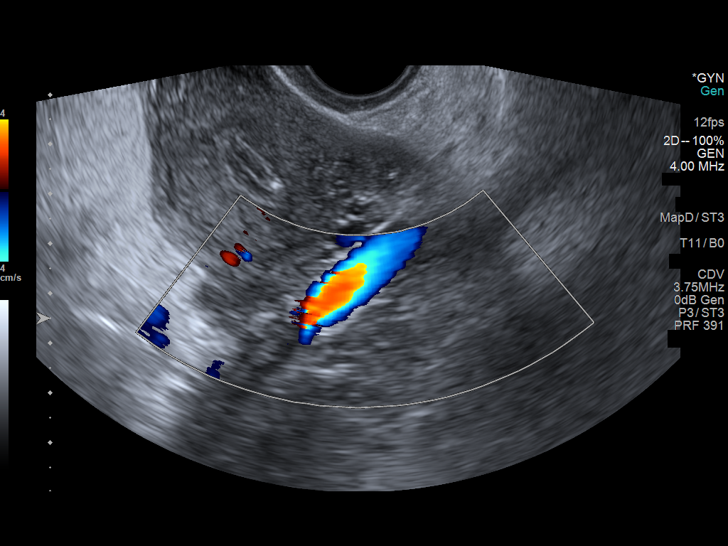

[13 of 25 positions shown; findings below may reference images not displayed]

FINDINGS: Uterus

Measurements: 8.7 x 4.1 x 4.7 cm = volume: 87 mL. No fibroids or
other mass visualized.

Endometrium

Thickness: 10 mm.  No focal abnormality visualized.

Right ovary

Measurements: 3.8 x 2.5 x 2.7 cm = volume: 13.3 mL. There is a
slightly complex cystic structure involving the right ovary
measuring approximately 2.1 x 2.4 cm. The cystic structure contains
a thin internal septation. This is almost certainly benign.

Left ovary

Measurements: 2.6 x 1.7 x 2.2 cm = volume: 4.8 mL. Normal
appearance/no adnexal mass.

Other findings

No abnormal free fluid.
IMPRESSION: No significant abnormality. The previously demonstrated left ovarian
cystic structures have resolved.

## 2021-08-28 ENCOUNTER — Encounter: Payer: Self-pay | Admitting: Emergency Medicine

## 2021-08-28 ENCOUNTER — Ambulatory Visit
Admission: EM | Admit: 2021-08-28 | Discharge: 2021-08-28 | Disposition: A | Discharge status: Home / Self Care | Payer: BC Managed Care – PPO | Attending: Urgent Care | Admitting: Urgent Care

## 2021-08-28 DIAGNOSIS — H01134 Eczematous dermatitis of left upper eyelid: Secondary | ICD-10-CM

## 2021-08-28 DIAGNOSIS — H01131 Eczematous dermatitis of right upper eyelid: Secondary | ICD-10-CM

## 2021-08-28 DIAGNOSIS — R21 Rash and other nonspecific skin eruption: Secondary | ICD-10-CM

## 2021-08-28 MED ORDER — PREDNISONE 20 MG PO TABS: ORAL_TABLET | ORAL | 0 refills | Status: AC

## 2021-08-28 MED ORDER — TRIAMCINOLONE ACETONIDE 0.1 % EX CREA: 1.0000 | TOPICAL_CREAM | Freq: Two times a day (BID) | CUTANEOUS | 0 refills | Status: AC

## 2021-08-28 MED ORDER — PIMECROLIMUS 1 % EX CREA: TOPICAL_CREAM | Freq: Two times a day (BID) | CUTANEOUS | 0 refills | Status: AC

## 2021-08-28 NOTE — ED Provider Notes (Signed)
[facility]   MRN: [id number] DOB: 12/22/1985  Subjective:   Michelle Frey is a 36 y.o. female presenting for 2-day history of acute onset itchy and irritating rash over the neck and now over both upper eyelids with slight swelling.  Patient states she has a dog that is actually undergoing a treatment for his skin as well.  Believes that the dog is getting steroid treatment.  She is in the process of moving and has been exposed to a lot of allergens.  She has a dermatologist but has not seen them lately.  Feels like she has had eczema for a long time now but has never been diagnosed.  No difficulty with her breathing, oral swelling, throat closing sensations.  No tenderness, weeping or drainage from her rashes.  No current facility-administered medications for this encounter.  Current Outpatient Medications:    ibuprofen (ADVIL) 800 MG tablet, Take 1 tablet (800 mg total) by mouth 3 (three) times daily with meals as needed for headache, moderate pain or cramping. (Patient not taking: Reported on 02/03/2021), Disp: 30 tablet, Rfl: 3   Multiple Vitamin (MULTIVITAMIN WITH MINERALS) TABS, Take 1 tablet by mouth daily. (Patient not taking: Reported on 02/03/2021), Disp: , Rfl:    Norethindrone Acetate-Ethinyl Estrad-FE (LOESTRIN 24 FE) 1-20 MG-MCG(24) tablet, Take 1 tablet by mouth daily., Disp: 84 tablet, Rfl: 3   nortriptyline (PAMELOR) 10 MG capsule, TAKE 1 CAPSULE(10 MG) BY MOUTH AT BEDTIME (Patient not taking: Reported on 02/03/2021), Disp: 90 capsule, Rfl: 0   Vitamin D, Ergocalciferol, (DRISDOL) 1.25 MG (50000 UNIT) CAPS capsule, Take 1 capsule (50,000 Units total) by mouth every 7 (seven) days. (Patient not taking: Reported on 02/03/2021), Disp: 12 capsule, Rfl: 0   Allergies  Allergen Reactions   Amoxicillin Diarrhea and Nausea And Vomiting   Penicillins Diarrhea and Nausea And Vomiting    Past Medical History:  Diagnosis Date   COVID-19 virus infection 11/2019    Overweight (BMI 25.0-29.9)      No past surgical history on file.  Family History  Problem Relation Age of Onset   Hyperlipidemia Mother    Pancreatic cancer Father    Glaucoma Sister    Alzheimer's disease Maternal Grandmother    Glaucoma Paternal Grandmother     Social History   Tobacco Use   Smoking status: Former    Types: E-cigarettes   Smokeless tobacco: Never  Building services engineer Use: Never used  Substance Use Topics   Alcohol use: Yes    Comment: rarely   Drug use: No    ROS   Objective:   Vitals: BP (!) 127/57   Pulse 75   Temp 98.8 F (37.1 C)   Resp 20   SpO2 98%   Physical Exam Constitutional:      General: She is not in acute distress.    Appearance: Normal appearance. She is well-developed. She is not ill-appearing, toxic-appearing or diaphoretic.  HENT:     Head: Normocephalic and atraumatic.     Nose: Nose normal.     Mouth/Throat:     Mouth: Mucous membranes are moist.     Pharynx: No pharyngeal swelling, oropharyngeal exudate, posterior oropharyngeal erythema or uvula swelling.     Tonsils: No tonsillar exudate or tonsillar abscesses. 0 on the right. 0 on the left.  Eyes:     General: No scleral icterus.       Right eye: No discharge.  Left eye: No discharge.     Extraocular Movements: Extraocular movements intact.  Cardiovascular:     Rate and Rhythm: Normal rate and regular rhythm.     Heart sounds: Normal heart sounds. No murmur heard.    No friction rub. No gallop.  Pulmonary:     Effort: Pulmonary effort is normal. No respiratory distress.     Breath sounds: No stridor. No wheezing, rhonchi or rales.  Chest:     Chest wall: No tenderness.  Skin:    General: Skin is warm and dry.     Findings: Rash (dry scaly papular patches over the posterior neck, both upper eyelids with trace swelling at best) present.  Neurological:     General: No focal deficit present.     Mental Status: She is alert and oriented to person,  place, and time.  Psychiatric:        Mood and Affect: Mood normal.        Behavior: Behavior normal.     Assessment and Plan :   PDMP not reviewed this encounter.  1. Eczematous dermatitis of upper eyelids of both eyes   2. Rash and nonspecific skin eruption    Suspect an eczematous dermatitis and recommended an oral prednisone course.  Advised that she could apply Elidel to the face.  Use triamcinolone cream elsewhere.  Discussed appropriate use of steroids both orally and topically for management of her eczema.  Follow-up with her dermatologist. Counseled patient on potential for adverse effects with medications prescribed/recommended today, ER and return-to-clinic precautions discussed, patient verbalized understanding.    Wallis Bamberg, [place] 08/28/21 [phone]

## 2021-08-28 NOTE — Discharge Instructions (Addendum)
We will be using prednisone for 10 days in a short taper. You can use Elidel cream for facial eczema. Once you are done with your oral steroids you can use triamcinolone steroid cream except on the face. If you need to use it, apply thin layers, use it 1 week on and then 1 week off.

## 2021-08-28 NOTE — ED Triage Notes (Signed)
Pt here with bilateral upper eyelid swelling x 2 days along with rash to neck that is very itchy. Her dog has a bacterial skin infection and patient is wondering if it could be transferred to her.

## 2022-02-25 ENCOUNTER — Other Ambulatory Visit: Payer: Self-pay | Admitting: Obstetrics and Gynecology

## 2022-02-25 DIAGNOSIS — N926 Irregular menstruation, unspecified: Secondary | ICD-10-CM

## 2022-02-25 DIAGNOSIS — Z3009 Encounter for other general counseling and advice on contraception: Secondary | ICD-10-CM

## 2022-02-27 ENCOUNTER — Telehealth: Payer: Self-pay | Admitting: General Practice

## 2022-02-27 NOTE — Telephone Encounter (Signed)
-----   Message from Griffin Basil, MD sent at 02/27/2022 10:09 AM EST ----- Regarding: ocp Please contact patient and let her know I will send 1-2 months of ocp, but she needs to be seen for her annual exam to continue

## 2022-02-27 NOTE — Telephone Encounter (Signed)
Called patient, no answer- left message to check mychart account.

## 2022-05-20 ENCOUNTER — Other Ambulatory Visit: Payer: Self-pay | Admitting: Obstetrics and Gynecology

## 2022-05-20 DIAGNOSIS — N926 Irregular menstruation, unspecified: Secondary | ICD-10-CM

## 2022-05-20 DIAGNOSIS — Z3009 Encounter for other general counseling and advice on contraception: Secondary | ICD-10-CM

## 2022-08-17 ENCOUNTER — Other Ambulatory Visit: Payer: Self-pay | Admitting: Obstetrics and Gynecology

## 2022-08-17 DIAGNOSIS — N926 Irregular menstruation, unspecified: Secondary | ICD-10-CM

## 2022-08-17 DIAGNOSIS — Z3009 Encounter for other general counseling and advice on contraception: Secondary | ICD-10-CM
